# Patient Record
Sex: Male | Born: 1980 | Race: Black or African American | Hispanic: No | Marital: Married | Smoking: Never smoker
Health system: Southern US, Community
[De-identification: ages and names within clinical notes are randomized; demographics above are authoritative.]

## PROBLEM LIST (undated history)

## (undated) DIAGNOSIS — E785 Hyperlipidemia, unspecified: Secondary | ICD-10-CM

## (undated) DIAGNOSIS — I10 Essential (primary) hypertension: Secondary | ICD-10-CM

## (undated) DIAGNOSIS — N529 Male erectile dysfunction, unspecified: Secondary | ICD-10-CM

## (undated) DIAGNOSIS — N183 Chronic kidney disease, stage 3 unspecified: Secondary | ICD-10-CM

## (undated) HISTORY — DX: Male erectile dysfunction, unspecified: N52.9

## (undated) HISTORY — DX: Chronic kidney disease, stage 3 unspecified: N18.30

## (undated) HISTORY — PX: OTHER SURGICAL HISTORY: SHX169

## (undated) HISTORY — DX: Hyperlipidemia, unspecified: E78.5

## (undated) HISTORY — DX: Morbid (severe) obesity due to excess calories: E66.01

---

## 2009-12-17 ENCOUNTER — Emergency Department (HOSPITAL_COMMUNITY): Admission: EM | Admit: 2009-12-17 | Discharge: 2009-12-17 | Payer: Self-pay | Admitting: Family Medicine

## 2018-10-19 DIAGNOSIS — Z20828 Contact with and (suspected) exposure to other viral communicable diseases: Secondary | ICD-10-CM | POA: Diagnosis not present

## 2018-11-02 DIAGNOSIS — I10 Essential (primary) hypertension: Secondary | ICD-10-CM | POA: Diagnosis not present

## 2018-11-07 DIAGNOSIS — I10 Essential (primary) hypertension: Secondary | ICD-10-CM | POA: Diagnosis not present

## 2018-12-15 DIAGNOSIS — Z0000 Encounter for general adult medical examination without abnormal findings: Secondary | ICD-10-CM | POA: Diagnosis not present

## 2018-12-29 DIAGNOSIS — D473 Essential (hemorrhagic) thrombocythemia: Secondary | ICD-10-CM | POA: Diagnosis not present

## 2019-02-14 DIAGNOSIS — D473 Essential (hemorrhagic) thrombocythemia: Secondary | ICD-10-CM | POA: Diagnosis not present

## 2019-03-10 DIAGNOSIS — D473 Essential (hemorrhagic) thrombocythemia: Secondary | ICD-10-CM | POA: Diagnosis not present

## 2019-03-10 DIAGNOSIS — R7989 Other specified abnormal findings of blood chemistry: Secondary | ICD-10-CM | POA: Diagnosis not present

## 2019-03-22 DIAGNOSIS — D473 Essential (hemorrhagic) thrombocythemia: Secondary | ICD-10-CM | POA: Diagnosis not present

## 2019-04-14 ENCOUNTER — Inpatient Hospital Stay (HOSPITAL_COMMUNITY)
Admission: EM | Admit: 2019-04-14 | Discharge: 2019-04-16 | DRG: 177 | Disposition: A | Discharge status: Home / Self Care | Payer: HRSA Program | Attending: Internal Medicine | Admitting: Internal Medicine

## 2019-04-14 ENCOUNTER — Encounter (HOSPITAL_COMMUNITY): Payer: Self-pay

## 2019-04-14 ENCOUNTER — Other Ambulatory Visit: Payer: Self-pay

## 2019-04-14 ENCOUNTER — Emergency Department (HOSPITAL_COMMUNITY): Payer: HRSA Program

## 2019-04-14 DIAGNOSIS — I248 Other forms of acute ischemic heart disease: Secondary | ICD-10-CM | POA: Diagnosis present

## 2019-04-14 DIAGNOSIS — K429 Umbilical hernia without obstruction or gangrene: Secondary | ICD-10-CM | POA: Diagnosis present

## 2019-04-14 DIAGNOSIS — E871 Hypo-osmolality and hyponatremia: Secondary | ICD-10-CM | POA: Diagnosis present

## 2019-04-14 DIAGNOSIS — I10 Essential (primary) hypertension: Secondary | ICD-10-CM | POA: Diagnosis present

## 2019-04-14 DIAGNOSIS — U071 COVID-19: Principal | ICD-10-CM | POA: Diagnosis present

## 2019-04-14 DIAGNOSIS — E860 Dehydration: Secondary | ICD-10-CM | POA: Diagnosis present

## 2019-04-14 DIAGNOSIS — J1289 Other viral pneumonia: Secondary | ICD-10-CM | POA: Diagnosis present

## 2019-04-14 DIAGNOSIS — A0839 Other viral enteritis: Secondary | ICD-10-CM | POA: Diagnosis present

## 2019-04-14 DIAGNOSIS — N179 Acute kidney failure, unspecified: Secondary | ICD-10-CM | POA: Diagnosis present

## 2019-04-14 DIAGNOSIS — Z79899 Other long term (current) drug therapy: Secondary | ICD-10-CM | POA: Diagnosis not present

## 2019-04-14 DIAGNOSIS — G4733 Obstructive sleep apnea (adult) (pediatric): Secondary | ICD-10-CM | POA: Diagnosis present

## 2019-04-14 DIAGNOSIS — Z6841 Body Mass Index (BMI) 40.0 and over, adult: Secondary | ICD-10-CM

## 2019-04-14 DIAGNOSIS — J9601 Acute respiratory failure with hypoxia: Secondary | ICD-10-CM | POA: Diagnosis present

## 2019-04-14 DIAGNOSIS — A4189 Other specified sepsis: Secondary | ICD-10-CM | POA: Diagnosis present

## 2019-04-14 DIAGNOSIS — E669 Obesity, unspecified: Secondary | ICD-10-CM | POA: Diagnosis present

## 2019-04-14 DIAGNOSIS — R197 Diarrhea, unspecified: Secondary | ICD-10-CM | POA: Diagnosis present

## 2019-04-14 HISTORY — DX: Essential (primary) hypertension: I10

## 2019-04-14 LAB — C DIFFICILE QUICK SCREEN W PCR REFLEX
C Diff antigen: NEGATIVE
C Diff interpretation: NOT DETECTED
C Diff toxin: NEGATIVE

## 2019-04-14 LAB — CBC WITH DIFFERENTIAL/PLATELET
Abs Immature Granulocytes: 0.01 10*3/uL (ref 0.00–0.07)
Basophils Absolute: 0 10*3/uL (ref 0.0–0.1)
Basophils Relative: 0 %
Eosinophils Absolute: 0 10*3/uL (ref 0.0–0.5)
Eosinophils Relative: 0 %
HCT: 43.5 % (ref 39.0–52.0)
Hemoglobin: 14.3 g/dL (ref 13.0–17.0)
Immature Granulocytes: 0 %
Lymphocytes Relative: 12 %
Lymphs Abs: 0.8 10*3/uL (ref 0.7–4.0)
MCH: 26.9 pg (ref 26.0–34.0)
MCHC: 32.9 g/dL (ref 30.0–36.0)
MCV: 81.9 fL (ref 80.0–100.0)
Monocytes Absolute: 0.4 10*3/uL (ref 0.1–1.0)
Monocytes Relative: 6 %
Neutro Abs: 5.2 10*3/uL (ref 1.7–7.7)
Neutrophils Relative %: 82 %
Platelets: 307 10*3/uL (ref 150–400)
RBC: 5.31 MIL/uL (ref 4.22–5.81)
RDW: 14.6 % (ref 11.5–15.5)
WBC: 6.3 10*3/uL (ref 4.0–10.5)
nRBC: 0 % (ref 0.0–0.2)

## 2019-04-14 LAB — COMPREHENSIVE METABOLIC PANEL
ALT: 26 U/L (ref 0–44)
AST: 27 U/L (ref 15–41)
Albumin: 4 g/dL (ref 3.5–5.0)
Alkaline Phosphatase: 48 U/L (ref 38–126)
Anion gap: 11 (ref 5–15)
BUN: 13 mg/dL (ref 6–20)
CO2: 23 mmol/L (ref 22–32)
Calcium: 8.5 mg/dL — ABNORMAL LOW (ref 8.9–10.3)
Chloride: 97 mmol/L — ABNORMAL LOW (ref 98–111)
Creatinine, Ser: 1.66 mg/dL — ABNORMAL HIGH (ref 0.61–1.24)
GFR calc Af Amer: >60 mL/min (ref >60)
GFR calc non Af Amer: 52 mL/min — ABNORMAL LOW (ref >60)
Glucose, Bld: 106 mg/dL — ABNORMAL HIGH (ref 70–99)
Potassium: 3.8 mmol/L (ref 3.5–5.1)
Sodium: 131 mmol/L — ABNORMAL LOW (ref 135–145)
Total Bilirubin: 0.8 mg/dL (ref 0.3–1.2)
Total Protein: 7.8 g/dL (ref 6.5–8.1)

## 2019-04-14 LAB — URINALYSIS, ROUTINE W REFLEX MICROSCOPIC
Bilirubin Urine: NEGATIVE
Glucose, UA: NEGATIVE mg/dL
Ketones, ur: NEGATIVE mg/dL
Leukocytes,Ua: NEGATIVE
Nitrite: NEGATIVE
Protein, ur: 100 mg/dL — AB
Specific Gravity, Urine: 1.018 (ref 1.005–1.030)
pH: 5 (ref 5.0–8.0)

## 2019-04-14 LAB — TROPONIN I (HIGH SENSITIVITY)
Troponin I (High Sensitivity): 20 ng/L — ABNORMAL HIGH (ref <18)
Troponin I (High Sensitivity): 30 ng/L — ABNORMAL HIGH (ref <18)

## 2019-04-14 LAB — MRSA PCR SCREENING: MRSA by PCR: NEGATIVE

## 2019-04-14 LAB — FIBRINOGEN: Fibrinogen: 368 mg/dL (ref 210–475)

## 2019-04-14 LAB — POC SARS CORONAVIRUS 2 AG -  ED: SARS Coronavirus 2 Ag: POSITIVE — AB

## 2019-04-14 LAB — LACTIC ACID, PLASMA
Lactic Acid, Venous: 1.2 mmol/L (ref 0.5–1.9)
Lactic Acid, Venous: 1.1 mmol/L (ref 0.5–1.9)

## 2019-04-14 LAB — PROCALCITONIN: Procalcitonin: 0.55 ng/mL

## 2019-04-14 LAB — LIPASE, BLOOD: Lipase: 39 U/L (ref 11–51)

## 2019-04-14 LAB — CBG MONITORING, ED: Glucose-Capillary: 93 mg/dL (ref 70–99)

## 2019-04-14 LAB — FERRITIN: Ferritin: 149 ng/mL (ref 24–336)

## 2019-04-14 LAB — D-DIMER, QUANTITATIVE: D-Dimer, Quant: 0.75 ug/mL-FEU — ABNORMAL HIGH (ref 0.00–0.50)

## 2019-04-14 LAB — C-REACTIVE PROTEIN: CRP: 4.5 mg/dL — ABNORMAL HIGH (ref <1.0)

## 2019-04-14 LAB — TRIGLYCERIDES: Triglycerides: 73 mg/dL (ref <150)

## 2019-04-14 LAB — LACTATE DEHYDROGENASE: LDH: 237 U/L — ABNORMAL HIGH (ref 98–192)

## 2019-04-14 MED ORDER — ONDANSETRON HCL 4 MG/2ML IJ SOLN
4.0000 mg | Freq: Once | INTRAMUSCULAR | Status: DC
  Filled 2019-04-14: qty 2

## 2019-04-14 MED ORDER — IOHEXOL 300 MG/ML  SOLN
100.0000 mL | Freq: Once | INTRAMUSCULAR | Status: AC | PRN
  Administered 2019-04-14: 80 mL via INTRAVENOUS

## 2019-04-14 MED ORDER — FENTANYL CITRATE (PF) 100 MCG/2ML IJ SOLN
50.0000 ug | Freq: Once | INTRAMUSCULAR | Status: DC
  Filled 2019-04-14: qty 2

## 2019-04-14 MED ORDER — ENOXAPARIN SODIUM 60 MG/0.6ML ~~LOC~~ SOLN
60.0000 mg | Freq: Two times a day (BID) | SUBCUTANEOUS | Status: DC
  Filled 2019-04-14: qty 0.6

## 2019-04-14 MED ORDER — CHLORHEXIDINE GLUCONATE CLOTH 2 % EX PADS
6.0000 | MEDICATED_PAD | Freq: Every day | CUTANEOUS | Status: DC
  Administered 2019-04-14: 17:00:00 6 via TOPICAL

## 2019-04-14 MED ORDER — ACETAMINOPHEN 500 MG PO TABS
ORAL_TABLET | ORAL | Status: AC
  Administered 2019-04-14: 1000 mg
  Filled 2019-04-14: qty 2

## 2019-04-14 MED ORDER — ACETAMINOPHEN 500 MG PO TABS
1000.0000 mg | ORAL_TABLET | Freq: Once | ORAL | Status: AC
  Administered 2019-04-14: 12:00:00 1000 mg via ORAL

## 2019-04-14 MED ORDER — SODIUM CHLORIDE 0.9 % IV BOLUS
1000.0000 mL | Freq: Once | INTRAVENOUS | Status: AC
  Administered 2019-04-14: 1000 mL via INTRAVENOUS

## 2019-04-14 MED ORDER — SODIUM CHLORIDE 0.9% FLUSH
3.0000 mL | Freq: Once | INTRAVENOUS | Status: AC
  Administered 2019-04-14: 3 mL via INTRAVENOUS

## 2019-04-14 MED ORDER — ONDANSETRON HCL 4 MG PO TABS: 4.0000 mg | ORAL_TABLET | Freq: Four times a day (QID) | ORAL | Status: DC | PRN

## 2019-04-14 MED ORDER — METHYLPREDNISOLONE SODIUM SUCC 40 MG IJ SOLR
40.0000 mg | Freq: Two times a day (BID) | INTRAMUSCULAR | Status: DC
  Administered 2019-04-14 – 2019-04-16 (×4): 40 mg via INTRAVENOUS
  Filled 2019-04-14 (×4): qty 1

## 2019-04-14 MED ORDER — SODIUM CHLORIDE 0.9 % IV SOLN
100.0000 mg | Freq: Every day | INTRAVENOUS | Status: DC
  Administered 2019-04-15 – 2019-04-16 (×2): 100 mg via INTRAVENOUS
  Filled 2019-04-14 (×2): qty 100

## 2019-04-14 MED ORDER — SODIUM CHLORIDE 0.9 % IV SOLN
200.0000 mg | Freq: Once | INTRAVENOUS | Status: AC
  Administered 2019-04-14: 200 mg via INTRAVENOUS
  Filled 2019-04-14: qty 200

## 2019-04-14 MED ORDER — ONDANSETRON HCL 4 MG/2ML IJ SOLN: 4.0000 mg | Freq: Four times a day (QID) | INTRAMUSCULAR | Status: DC | PRN

## 2019-04-14 MED ORDER — ASCORBIC ACID 500 MG PO TABS
500.0000 mg | ORAL_TABLET | Freq: Every day | ORAL | Status: DC
  Administered 2019-04-14 – 2019-04-16 (×3): 500 mg via ORAL
  Filled 2019-04-14 (×3): qty 1

## 2019-04-14 MED ORDER — HYDRALAZINE HCL 20 MG/ML IJ SOLN
10.0000 mg | Freq: Four times a day (QID) | INTRAMUSCULAR | Status: DC | PRN
  Administered 2019-04-14 – 2019-04-16 (×3): 10 mg via INTRAVENOUS
  Filled 2019-04-14 (×3): qty 1

## 2019-04-14 MED ORDER — SODIUM CHLORIDE (PF) 0.9 % IJ SOLN
INTRAMUSCULAR | Status: AC
  Filled 2019-04-14: qty 50

## 2019-04-14 MED ORDER — ORAL CARE MOUTH RINSE
15.0000 mL | Freq: Two times a day (BID) | OROMUCOSAL | Status: DC
  Administered 2019-04-14 – 2019-04-16 (×4): 15 mL via OROMUCOSAL

## 2019-04-14 MED ORDER — ACETAMINOPHEN 325 MG PO TABS
650.0000 mg | ORAL_TABLET | Freq: Four times a day (QID) | ORAL | Status: DC | PRN
  Administered 2019-04-14 – 2019-04-15 (×2): 650 mg via ORAL
  Filled 2019-04-14 (×2): qty 2

## 2019-04-14 MED ORDER — ENOXAPARIN SODIUM 60 MG/0.6ML ~~LOC~~ SOLN
60.0000 mg | SUBCUTANEOUS | Status: DC
  Administered 2019-04-14 – 2019-04-15 (×2): 60 mg via SUBCUTANEOUS
  Filled 2019-04-14 (×2): qty 0.6

## 2019-04-14 MED ORDER — SODIUM CHLORIDE 0.9 % IV BOLUS
500.0000 mL | Freq: Once | INTRAVENOUS | Status: AC
  Administered 2019-04-14: 19:00:00 500 mL via INTRAVENOUS

## 2019-04-14 MED ORDER — ACETAMINOPHEN 500 MG PO TABS: 1000.0000 mg | ORAL_TABLET | Freq: Once | ORAL | Status: DC

## 2019-04-14 MED ORDER — LACTATED RINGERS IV SOLN: INTRAVENOUS | Status: AC

## 2019-04-14 MED ORDER — IBUPROFEN 800 MG PO TABS
800.0000 mg | ORAL_TABLET | Freq: Once | ORAL | Status: AC
  Administered 2019-04-14: 14:00:00 800 mg via ORAL
  Filled 2019-04-14: qty 1

## 2019-04-14 MED ORDER — ZINC SULFATE 220 (50 ZN) MG PO CAPS
220.0000 mg | ORAL_CAPSULE | Freq: Every day | ORAL | Status: DC
  Administered 2019-04-14 – 2019-04-16 (×3): 220 mg via ORAL
  Filled 2019-04-14 (×3): qty 1

## 2019-04-14 NOTE — ED Notes (Signed)
Patient found to be 87% RA, 3 L Harper applied to bring patient up to 94% RA.

## 2019-04-14 NOTE — H&P (Signed)
History and Physical    Dustin Young ZOX:[id number] DOB: Jul 22, 1980 DOA: 04/14/2019  PCP: Nonnie DoneSlatosky, John J., MD   Patient coming from: home  Chief Complaint: diarrhea  HPI: Dustin ChafeJamarcus D Young is a 38 y.o. male with medical history significant for morbid obesity with BMI 44.7, history of hypertension not on meds comes to the ER for evaluation of diarrhea for the past 24 hours.  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  Reports having loose watery stool multiple times past 24 hours along with generalized abdominal discomfort with associated chills but no vomiting.  Diarrhea is worsened by this time he takes food.  Patient denies any chest pain, shortness of breath, headache, focal weakness, hemoptysis, cough.  In ER: Febrile up to 102.9, blood pressure 160s to 180s, tachycardic 120s, tachypneic 19-30/min, hypoxic with oxygen up to 85% on room airm placed on  Lely 3l  with spo2 at 94%.  Labs showed AKI with creatinine 1.6, lactic acid 1.2, sodium 131, rapid Covid antigen positive, stool was sent for C. difficile and GI panel. CXR "Probable mild hazy opacity identified in the right lung base which may be due to developing pneumonia" CT abdomen no acute abdominal findings but "Innumerable nodular and masslike opacities are identified in the visualized lung bases.The findings are nonspecific but could be due to infectious/inflammatory etiology" .  Review of Systems: All systems were reviewed and were negative except as mentioned in HPI above. Negative for headaches Negative for chest pain Negative for shortness of breath  Past Medical History:  Diagnosis Date   Hypertension    Prior to Admission medications   Not on File    Physical Exam: Vitals:   04/14/19 1247 04/14/19 1400 04/14/19 1401 04/14/19 1413  BP: (!) 181/123 (!) 167/127    Pulse: (!) 119 (!) 126 (!) 124 (!) 124  Resp: (!) 24 19 (!) 30 17  Temp:   (!) 102.9 F (39.4 C)   TempSrc:    Oral   SpO2: 91% (!) 85% (!) 87% 94%  Weight:      Height:       General exam: Obese, alert awake, not in acute distress, on 3 L nasal cannula.  HEENT:Oral mucosa DRY, Ear/Nose WNL grossly, dentition normal. Respiratory system: Bilateral diminished breath sound, limited auscultation due to his body habitus, no use of accessory muscle, non tender on palpation. Cardiovascular system: regular rate and rhythm, tachycardic, S1 & S2 heard, No JVD/murmurs. Gastrointestinal system: Abdomen soft, mildly tender, obese, BS +.  Nervous System:Alert, awake and oriented at baseline. Able to move UE and LE, sensation intact. Extremities: No edema, distal peripheral pulses palpable.  Skin: No rashes,no icterus. MSK: Normal muscle bulk,tone, power  Labs on Admission: I have personally reviewed following labs and imaging studies  CBC: Recent Labs  Lab 04/14/19 1134  WBC 6.3  NEUTROABS 5.2  HGB 14.3  HCT 43.5  MCV 81.9  PLT 307   Basic Metabolic Panel: Recent Labs  Lab 04/14/19 1134  NA 131*  K 3.8  CL 97*  CO2 23  GLUCOSE 106*  BUN 13  CREATININE 1.66*  CALCIUM 8.5*   GFR: Estimated Creatinine Clearance: 92.6 mL/min (A) (by C-G formula based on SCr of 1.66 mg/dL (H)). Liver Function Tests: Recent Labs  Lab 04/14/19 1134  AST 27  ALT 26  ALKPHOS 48  BILITOT 0.8  PROT 7.8  ALBUMIN 4.0   Recent Labs  Lab 04/14/19 1220  LIPASE 39  No results for input(s): AMMONIA in the last 168 hours. Coagulation Profile: No results for input(s): INR, PROTIME in the last 168 hours. Cardiac Enzymes: No results for input(s): CKTOTAL, CKMB, CKMBINDEX, TROPONINI in the last 168 hours. BNP (last 3 results) No results for input(s): PROBNP in the last 8760 hours. HbA1C: No results for input(s): HGBA1C in the last 72 hours. CBG: Recent Labs  Lab 04/14/19 1213  GLUCAP 93   Lipid Profile: No results for input(s): CHOL, HDL, LDLCALC, TRIG, CHOLHDL, LDLDIRECT in the last 72 hours. Thyroid  Function Tests: No results for input(s): TSH, T4TOTAL, FREET4, T3FREE, THYROIDAB in the last 72 hours. Anemia Panel: No results for input(s): VITAMINB12, FOLATE, FERRITIN, TIBC, IRON, RETICCTPCT in the last 72 hours. Urine analysis:    Component Value Date/Time   COLORURINE YELLOW 04/14/2019 1134   APPEARANCEUR CLEAR 04/14/2019 1134   LABSPEC 1.018 04/14/2019 1134   PHURINE 5.0 04/14/2019 1134   GLUCOSEU NEGATIVE 04/14/2019 1134   HGBUR MODERATE (A) 04/14/2019 1134   BILIRUBINUR NEGATIVE 04/14/2019 1134   KETONESUR NEGATIVE 04/14/2019 1134   PROTEINUR 100 (A) 04/14/2019 1134   NITRITE NEGATIVE 04/14/2019 1134   LEUKOCYTESUR NEGATIVE 04/14/2019 1134    Radiological Exams on Admission: CT ABDOMEN PELVIS W CONTRAST  Result Date: 04/14/2019 CLINICAL DATA:  Diarrhea for 24 hours. COVID-19 positive EXAM: CT ABDOMEN AND PELVIS WITH CONTRAST TECHNIQUE: Multidetector CT imaging of the abdomen and pelvis was performed using the standard protocol following bolus administration of intravenous contrast. CONTRAST:  24mL OMNIPAQUE IOHEXOL 300 MG/ML  SOLN COMPARISON:  None. FINDINGS: Lower chest: Innumerable nodular and masslike opacities are identified in the visualized lung bases. There is no pleural effusion. The heart size is normal. Hepatobiliary: No focal liver abnormality is seen. No gallstones, gallbladder wall thickening, or biliary dilatation. Pancreas: Unremarkable. No pancreatic ductal dilatation or surrounding inflammatory changes. Spleen: Normal in size without focal abnormality. Adrenals/Urinary Tract: Adrenal glands are unremarkable. Kidneys are normal, without renal calculi, focal lesion, or hydronephrosis. Bladder is unremarkable. Stomach/Bowel: Stomach is within normal limits. Appendix appears normal. No evidence of bowel wall thickening, distention, or inflammatory changes. Vascular/Lymphatic: No significant vascular findings are present. No enlarged abdominal or pelvic lymph nodes.  Reproductive: Prostate is unremarkable. Other: Small umbilical herniation of mesenteric fat is identified. Musculoskeletal: No acute or significant osseous findings. IMPRESSION: 1. No acute abnormality identified in the abdomen and pelvis. 2. Innumerable nodular and masslike opacities are identified in the visualized lung bases. The findings are nonspecific but could be due to infectious/inflammatory etiology. Electronically Signed   By: Sherian Rein M.D.   On: 04/14/2019 14:30   DG Chest Port 1 View  Result Date: 04/14/2019 CLINICAL DATA:  Diarrhea for 24 hours.  COVID-19 positive. EXAM: PORTABLE CHEST 1 VIEW COMPARISON:  None. FINDINGS: The heart size and mediastinal contours are within normal limits. Probable mild hazy opacity identified in the right lung base. There is no pulmonary edema or pleural effusion. The visualized skeletal structures are unremarkable. IMPRESSION: Probable mild hazy opacity identified in the right lung base which may be due to developing pneumonia. Electronically Signed   By: Sherian Rein M.D.   On: 04/14/2019 12:46    Assessment/Plan  Acute respiratory failure with hypoxia/Pneumonia due to COVID-19 virus: Patient presented with fever tachycardia hypoxia and diarrhea-rapid Covid antigen positive and imaging CXR "Probable mild hazy opacity identified in the right lung base which may be due to developing pneumonia" CT abdomen in lower lungs -"Innumerable nodular and masslike opacities are identified  in the visualized lung bases. The findings are nonspecific but could be due to infectious/inflammatory etiology".  We will start him on remdesivir pharmacy to dose, and Solu-Medrol, zinc, vitamin C. follow-up in inflammatory markers: crp came back and 4.5 ( <7),D dimer at  Slightly + 0.75, PROCAL PENDING.Appears dehydrated and we will keep him on RL ivf- receiving second liter bolus discussed different treatment modalities and patient agreeable with the plan. Admit In step down  unit  Diarrhea "No acute abnormality identified in the abdomen and pelvis in CT abdomen, this is likely due to patient's Covid infection.  C. difficile and GI panel sent.  AKI: creat at 1.6, from diarrhea, covid infection.  He got 1 L bolus  NSS earlier and getting second liter now. We will continue on RLAt 125 ml/hr.  Sinus tachycardia: from #1/2. Monitor. Cont ivf. Check tsh.  Hyponatremia: sod at 131: Due to diarrhea poor oral intake. Continue IV fluid hydration  Essential hypertension- Not taking meds BP on higher side.  Allow blood pressure on the higher side.  Initiate antihypertensive if persistently high  Troponin + slightly up at 20, TREND with 2nd set. He does not have chest pain.  Likely from tachycardia etc. demand mismatch.  Morbid obesity with BMI 44.7 : Check hemoglobin A1c.  Will need healthy lifestyle diet education upon discharge  Body mass index is 44.76 kg/m.   Severity of Illness: The appropriate patient status for this patient is INPATIENT. Inpatient status is judged to be reasonable and necessary in order to provide the required intensity of service to ensure the patient's safety. The patient's presenting symptoms, physical exam findings, and initial radiographic and laboratory data in the context of their chronic comorbidities is felt to place them at high risk for further clinical deterioration. Furthermore, it is not anticipated that the patient will be medically stable for discharge from the hospital within 2 midnights of admission. The following factors support the patient status of inpatient.   " The patient's presenting symptoms include diarrhea. " The worrisome physical exam findings include respiratory distress. " The initial radiographic and laboratory data are worrisome because of covid pneumonia. " The chronic co-morbidities include morbid ovesity   * I certify that at the point of admission it is my clinical judgment that the patient will require  inpatient hospital care spanning beyond 2 midnights from the point of admission due to high intensity of service, high risk for further deterioration and high frequency of surveillance required .    DVT prophylaxis:Lovenox Code Status: Full code Family Communication: Admission, patients condition and plan of care including tests being ordered have been discussed with the patient  who indicate understanding and agree with the plan and Code Status.  Consults called:   Antonieta Pert MD [facility] Pager [phone]  If 7PM-7AM, please contact night-coverage [email or web address]  04/14/2019, 3:08 PM

## 2019-04-14 NOTE — ED Notes (Signed)
Patient transported to CT via stretcher.

## 2019-04-14 NOTE — ED Provider Notes (Signed)
[facility] DEPT Provider Note   CSN: [id number] Arrival date & time: 04/14/19  1108     History Chief Complaint  Patient presents with  . Diarrhea    Dustin Young is a 38 y.o. male hx of HTN, who presented with abdominal pain, diarrhea.  Patient states that he has profuse watery diarrhea since yesterday .  Also has some chills as well but denies any vomiting .  Patient states that whenever he eats a just passed to him and he has diarrhea.  He was noted to be hypoxic 83% on room air and denies any shortness of breath.  Patient denies any contacts with known Covid.   The history is provided by the patient.       Past Medical History:  Diagnosis Date  . Hypertension     There are no problems to display for this patient.   History reviewed. No pertinent surgical history.     No family history on file.  Social History   Tobacco Use  . Smoking status: Never Smoker  . Smokeless tobacco: Never Used  Substance Use Topics  . Alcohol use: Yes    Comment: social  . Drug use: Never    Home Medications Prior to Admission medications   Not on File    Allergies    Patient has no known allergies.  Review of Systems   Review of Systems  Gastrointestinal: Positive for diarrhea.  All other systems reviewed and are negative.   Physical Exam Updated Vital Signs BP (!) 167/127   Pulse (!) 124   Temp (!) 102.9 F (39.4 C) (Oral)   Resp 17   Ht 6' (1.829 m)   Wt (!) 149.7 kg   SpO2 94%   BMI 44.76 kg/m   Physical Exam Vitals and nursing note reviewed.  HENT:     Head: Normocephalic.     Nose: Nose normal.     Mouth/Throat:     Mouth: Mucous membranes are dry.  Eyes:     Extraocular Movements: Extraocular movements intact.     Pupils: Pupils are equal, round, and reactive to light.  Cardiovascular:     Rate and Rhythm: Regular rhythm. Tachycardia present.     Pulses: Normal pulses.     Heart sounds: Normal heart sounds.    Pulmonary:     Effort: Pulmonary effort is normal.     Comments: Diminished throughout  Abdominal:     General: Abdomen is flat.     Comments: + RUQ tenderness, small umbilical hernia that is reducible easily   Musculoskeletal:        General: Normal range of motion.     Cervical back: Normal range of motion.  Skin:    General: Skin is warm.     Capillary Refill: Capillary refill takes less than 2 seconds.  Neurological:     General: No focal deficit present.     Mental Status: He is alert and oriented to person, place, and time.  Psychiatric:        Mood and Affect: Mood normal.        Behavior: Behavior normal.     ED Results / Procedures / Treatments   Labs (all labs ordered are listed, but only abnormal results are displayed) Labs Reviewed  COMPREHENSIVE METABOLIC PANEL - Abnormal; Notable for the following components:      Result Value   Sodium 131 (*)    Chloride 97 (*)    Glucose, Bld  106 (*)    Creatinine, Ser 1.66 (*)    Calcium 8.5 (*)    GFR calc non Af Amer 52 (*)    All other components within normal limits  POC SARS CORONAVIRUS 2 AG -  ED - Abnormal; Notable for the following components:   SARS Coronavirus 2 Ag POSITIVE (*)    All other components within normal limits  TROPONIN I (HIGH SENSITIVITY) - Abnormal; Notable for the following components:   Troponin I (High Sensitivity) 20 (*)    All other components within normal limits  C DIFFICILE QUICK SCREEN W PCR REFLEX  GI PATHOGEN PANEL BY PCR, STOOL  CULTURE, BLOOD (ROUTINE X 2)  CULTURE, BLOOD (ROUTINE X 2)  LACTIC ACID, PLASMA  CBC WITH DIFFERENTIAL/PLATELET  LIPASE, BLOOD  LACTIC ACID, PLASMA  URINALYSIS, ROUTINE W REFLEX MICROSCOPIC  CBG MONITORING, ED    EKG EKG Interpretation  Date/Time:  Friday April 14 2019 11:50:21 EST Ventricular Rate:  108 PR Interval:    QRS Duration: 81 QT Interval:  329 QTC Calculation: 441 R Axis:   58 Text Interpretation: Sinus tachycardia ST  elevation, consider anterior injury No previous ECGs available Confirmed by Richardean CanalYao, Leata Dominy H [phone]([id number]) on 04/14/2019 1:02:23 PM   Radiology CT ABDOMEN PELVIS W CONTRAST  Result Date: 04/14/2019 CLINICAL DATA:  Diarrhea for 24 hours. COVID-19 positive EXAM: CT ABDOMEN AND PELVIS WITH CONTRAST TECHNIQUE: Multidetector CT imaging of the abdomen and pelvis was performed using the standard protocol following bolus administration of intravenous contrast. CONTRAST:  80mL OMNIPAQUE IOHEXOL 300 MG/ML  SOLN COMPARISON:  None. FINDINGS: Lower chest: Innumerable nodular and masslike opacities are identified in the visualized lung bases. There is no pleural effusion. The heart size is normal. Hepatobiliary: No focal liver abnormality is seen. No gallstones, gallbladder wall thickening, or biliary dilatation. Pancreas: Unremarkable. No pancreatic ductal dilatation or surrounding inflammatory changes. Spleen: Normal in size without focal abnormality. Adrenals/Urinary Tract: Adrenal glands are unremarkable. Kidneys are normal, without renal calculi, focal lesion, or hydronephrosis. Bladder is unremarkable. Stomach/Bowel: Stomach is within normal limits. Appendix appears normal. No evidence of bowel wall thickening, distention, or inflammatory changes. Vascular/Lymphatic: No significant vascular findings are present. No enlarged abdominal or pelvic lymph nodes. Reproductive: Prostate is unremarkable. Other: Small umbilical herniation of mesenteric fat is identified. Musculoskeletal: No acute or significant osseous findings. IMPRESSION: 1. No acute abnormality identified in the abdomen and pelvis. 2. Innumerable nodular and masslike opacities are identified in the visualized lung bases. The findings are nonspecific but could be due to infectious/inflammatory etiology. Electronically Signed   By: Sherian ReinWei-Chen  Lin M.D.   On: 04/14/2019 14:30   DG Chest Port 1 View  Result Date: 04/14/2019 CLINICAL DATA:  Diarrhea for 24 hours.   COVID-19 positive. EXAM: PORTABLE CHEST 1 VIEW COMPARISON:  None. FINDINGS: The heart size and mediastinal contours are within normal limits. Probable mild hazy opacity identified in the right lung base. There is no pulmonary edema or pleural effusion. The visualized skeletal structures are unremarkable. IMPRESSION: Probable mild hazy opacity identified in the right lung base which may be due to developing pneumonia. Electronically Signed   By: Sherian ReinWei-Chen  Lin M.D.   On: 04/14/2019 12:46    Procedures Procedures (including critical care time)  CRITICAL CARE Performed by: Richardean Canalavid H Janye Maynor   Total critical care time: 30 minutes  Critical care time was exclusive of separately billable procedures and treating other patients.  Critical care was necessary to treat or prevent imminent or life-threatening deterioration.  Critical care was time spent personally by me on the following activities: development of treatment plan with patient and/or surrogate as well as nursing, discussions with consultants, evaluation of patient's response to treatment, examination of patient, obtaining history from patient or surrogate, ordering and performing treatments and interventions, ordering and review of laboratory studies, ordering and review of radiographic studies, pulse oximetry and re-evaluation of patient's condition.   Medications Ordered in ED Medications  sodium chloride flush (NS) 0.9 % injection 3 mL (has no administration in time range)  ondansetron (ZOFRAN) injection 4 mg (0 mg Intravenous Hold 04/14/19 1400)  fentaNYL (SUBLIMAZE) injection 50 mcg (0 mcg Intravenous Hold 04/14/19 1400)  sodium chloride (PF) 0.9 % injection (has no administration in time range)  acetaminophen (TYLENOL) 500 MG tablet (1,000 mg  Given 04/14/19 1130)  acetaminophen (TYLENOL) tablet 1,000 mg (1,000 mg Oral Given 04/14/19 1130)  sodium chloride 0.9 % bolus 1,000 mL (0 mLs Intravenous Stopped 04/14/19 1402)  iohexol  (OMNIPAQUE) 300 MG/ML solution 100 mL (80 mLs Intravenous Contrast Given 04/14/19 1304)  ibuprofen (ADVIL) tablet 800 mg (800 mg Oral Given 04/14/19 1420)    ED Course  I have reviewed the triage vital signs and the nursing notes.  Pertinent labs & imaging results that were available during my care of the patient were reviewed by me and considered in my medical decision making (see chart for details).    MDM Rules/Calculators/A&P                      Dustin Young is a 38 y.o. male here with diarrhea, hypoxia, fever. Consider COVID vs viral gastro vs hepatitis vs C diff. Will get labs, sepsis workup, CT ab/pel, COVID, C diff, GI pathogen panel. Will hydrate and hold abx for now given possible C diff and COVID.   2:50 PM Patient's COVID is positive. Cr is 1.66, slightly elevated. CT ab/pel unremarkable. Remained hypoxic to 87% when he is off oxygen. Also persistently tachycardic. Will admit for COVID with hypoxia. GI pathogen and C diff sent. Held abx for now.   Dustin Young was evaluated in Emergency Department on 04/14/2019 for the symptoms described in the history of present illness. He was evaluated in the context of the global COVID-19 pandemic, which necessitated consideration that the patient might be at risk for infection with the SARS-CoV-2 virus that causes COVID-19. Institutional protocols and algorithms that pertain to the evaluation of patients at risk for COVID-19 are in a state of rapid change based on information released by regulatory bodies including the CDC and federal and state organizations. These policies and algorithms were followed during the patient's care in the ED.  Final Clinical Impression(s) / ED Diagnoses Final diagnoses:  None    Rx / DC Orders ED Discharge Orders    None       Charlynne Pander, MD 04/14/19 1453

## 2019-04-14 NOTE — ED Triage Notes (Signed)
Pt states that he has been having diarrhea x 24 hours. Pt denies any pain, no blood. Pt denies abd pain. Pt does state that he belly button is more of an outie than normal - no pain there either. Pt states diarrhea every time that he eats/'drinks

## 2019-04-14 NOTE — ED Notes (Signed)
ED TO INPATIENT HANDOFF REPORT  ED Nurse Name and Phone #: [place] G, [phone]  S Name/Age/Gender Dustin Young 38 y.o. male Room/Bed: WA13/WA13  Code Status   Code Status: Not on file  Home/SNF/Other Home Patient oriented to: self, place, time and situation Is this baseline? Yes   Triage Complete: Triage complete  Chief Complaint Acute respiratory failure with hypoxia ([place]) [J96.01]  Triage Note Pt states that he has been having diarrhea x 24 hours. Pt denies any pain, no blood. Pt denies abd pain. Pt does state that he belly button is more of an outie than normal - no pain there either. Pt states diarrhea every time that he eats/'drinks    Allergies No Known Allergies  Level of Care/Admitting Diagnosis ED Disposition    ED Disposition Condition Comment   Admit  Hospital Area: [place] [[id number]]  Level of Care: Stepdown [14]  Admit to SDU based on following criteria: Respiratory Distress:  Frequent assessment and/or intervention to maintain adequate ventilation/respiration, pulmonary toilet, and respiratory treatment.  Covid Evaluation: Confirmed COVID Positive  Diagnosis: Acute respiratory failure with hypoxia [facility] [place] [facility]) [[id number]]  Admitting Physician: Antonieta Pert [[id number]]  Attending Physician: Antonieta Pert [[id number]]  Estimated length of stay: 3 - 4 days  Certification:: I certify this patient will need inpatient services for at least 2 midnights       B Medical/Surgery History Past Medical History:  Diagnosis Date  . Hypertension    History reviewed. No pertinent surgical history.   A IV Location/Drains/Wounds Patient Lines/Drains/Airways Status   Active Line/Drains/Airways    Name:   Placement date:   Placement time:   Site:   Days:   Peripheral IV 04/14/19 Left Antecubital   04/14/19    1205    Antecubital   less than 1          Intake/Output Last 24 hours  Intake/Output Summary (Last 24 hours) at 04/14/2019 1552 Last data  filed at 04/14/2019 1402 Gross per 24 hour  Intake 1000 ml  Output --  Net 1000 ml    Labs/Imaging Results for orders placed or performed during the hospital encounter of 04/14/19 (from the past 48 hour(s))  Lactic acid, plasma     Status: None   Collection Time: 04/14/19 11:34 AM  Result Value Ref Range   Lactic Acid, Venous 1.2 0.5 - 1.9 mmol/L    Comment: Performed at [facility], [place] [address], [place], [place] [address]  Lactic acid, plasma     Status: None   Collection Time: 04/14/19 11:34 AM  Result Value Ref Range   Lactic Acid, Venous 1.1 0.5 - 1.9 mmol/L    Comment: Performed at [facility], [place] [address], [place], [place] [address]  Comprehensive metabolic panel     Status: Abnormal   Collection Time: 04/14/19 11:34 AM  Result Value Ref Range   Sodium 131 (L) 135 - 145 mmol/L   Potassium 3.8 3.5 - 5.1 mmol/L   Chloride 97 (L) 98 - 111 mmol/L   CO2 23 22 - 32 mmol/L   Glucose, Bld 106 (H) 70 - 99 mg/dL   BUN 13 6 - 20 mg/dL   Creatinine, Ser 1.66 (H) 0.61 - 1.24 mg/dL   Calcium 8.5 (L) 8.9 - 10.3 mg/dL   Total Protein 7.8 6.5 - 8.1 g/dL   Albumin 4.0 3.5 - 5.0 g/dL   AST 27 15 - 41 U/L   ALT 26 0 - 44 U/L  Alkaline Phosphatase 48 38 - 126 U/L   Total Bilirubin 0.8 0.3 - 1.2 mg/dL   GFR calc non Af Amer 52 (L) >60 mL/min   GFR calc Af Amer >60 >60 mL/min   Anion gap 11 5 - 15    Comment: Performed at [facility], [place] [address] [place], [place] [address]  CBC with Differential     Status: None   Collection Time: 04/14/19 11:34 AM  Result Value Ref Range   WBC 6.3 4.0 - 10.5 K/uL   RBC 5.31 4.22 - 5.81 MIL/uL   Hemoglobin 14.3 13.0 - 17.0 g/dL   HCT 43.5 39.0 - 52.0 %   MCV 81.9 80.0 - 100.0 fL   MCH 26.9 26.0 - 34.0 pg   MCHC 32.9 30.0 - 36.0 g/dL   RDW 14.6 11.5 - 15.5 %   Platelets 307 150 - 400 K/uL   nRBC 0.0 0.0 - 0.2 %   Neutrophils Relative % 82 %   Neutro Abs 5.2 1.7 - 7.7 K/uL    Lymphocytes Relative 12 %   Lymphs Abs 0.8 0.7 - 4.0 K/uL   Monocytes Relative 6 %   Monocytes Absolute 0.4 0.1 - 1.0 K/uL   Eosinophils Relative 0 %   Eosinophils Absolute 0.0 0.0 - 0.5 K/uL   Basophils Relative 0 %   Basophils Absolute 0.0 0.0 - 0.1 K/uL   Immature Granulocytes 0 %   Abs Immature Granulocytes 0.01 0.00 - 0.07 K/uL    Comment: Performed at [facility], [place] [address] [place], [place] [address]  Urinalysis, Routine w reflex microscopic     Status: Abnormal   Collection Time: 04/14/19 11:34 AM  Result Value Ref Range   Color, Urine YELLOW YELLOW   APPearance CLEAR CLEAR   Specific Gravity, Urine 1.018 1.005 - 1.030   pH 5.0 5.0 - 8.0   Glucose, UA NEGATIVE NEGATIVE mg/dL   Hgb urine dipstick MODERATE (A) NEGATIVE   Bilirubin Urine NEGATIVE NEGATIVE   Ketones, ur NEGATIVE NEGATIVE mg/dL   Protein, ur 100 (A) NEGATIVE mg/dL   Nitrite NEGATIVE NEGATIVE   Leukocytes,Ua NEGATIVE NEGATIVE   RBC / HPF 0-5 0 - 5 RBC/hpf   WBC, UA 6-10 0 - 5 WBC/hpf   Bacteria, UA RARE (A) NONE SEEN   Squamous Epithelial / LPF 0-5 0 - 5   Mucus PRESENT     Comment: Performed at [facility], [place] [address], [place],  [address]  CBG monitoring, ED     Status: None   Collection Time: 04/14/19 12:13 PM  Result Value Ref Range   Glucose-Capillary 93 70 - 99 mg/dL  Troponin I (High Sensitivity)     Status: Abnormal   Collection Time: 04/14/19 12:20 PM  Result Value Ref Range   Troponin I (High Sensitivity) 20 (H) <18 ng/L    Comment: (NOTE) Elevated high sensitivity troponin I (hsTnI) values and significant  changes across serial measurements may suggest ACS but many other  chronic and acute conditions are known to elevate hsTnI results.  Refer to the "Links" section for chest pain algorithms and additional  guidance. Performed at [facility], [place] [address], [place],  [address]   Lipase, blood     Status: None    Collection Time: 04/14/19 12:20 PM  Result Value Ref Range   Lipase 39 11 - 51 U/L    Comment: Performed at [facility], [place] [address]  [place], [place] [address]  POC SARS Coronavirus 2 Ag-ED - Nasal Swab (BD Veritor Kit)     Status: Abnormal   Collection Time: 04/14/19 12:34 PM  Result Value Ref Range   SARS Coronavirus 2 Ag POSITIVE (A) NEGATIVE    Comment: (NOTE) SARS-CoV-2 antigen PRESENT. Positive results indicate the presence of viral antigens, but clinical correlation with patient history and other diagnostic information is necessary to determine patient infection status.  Positive results do not rule out bacterial infection or co-infection  with other viruses. False positive results are rare but can occur, and confirmatory RT-PCR testing may be appropriate in some circumstances. The expected result is Negative. Fact Sheet for Patients: PodPark.tn Fact Sheet for Providers: GiftContent.is  This test is not yet approved or cleared by the [place] FDA and  has been authorized for detection and/or diagnosis of SARS-CoV-2 by FDA under an Emergency Use Authorization (EUA).  This EUA will remain in effect (meaning this test can be used) for the duration of  the COVID-19 declaration under Section 564(b)(1) of the Act, 21 U.S.C. section 360bbb-3(b)(1), unless the a uthorization is terminated or revoked sooner.    CT ABDOMEN PELVIS W CONTRAST  Result Date: 04/14/2019 CLINICAL DATA:  Diarrhea for 24 hours. COVID-19 positive EXAM: CT ABDOMEN AND PELVIS WITH CONTRAST TECHNIQUE: Multidetector CT imaging of the abdomen and pelvis was performed using the standard protocol following bolus administration of intravenous contrast. CONTRAST:  63m OMNIPAQUE IOHEXOL 300 MG/ML  SOLN COMPARISON:  None. FINDINGS: Lower chest: Innumerable nodular and masslike opacities are identified in the visualized lung bases.  There is no pleural effusion. The heart size is normal. Hepatobiliary: No focal liver abnormality is seen. No gallstones, gallbladder wall thickening, or biliary dilatation. Pancreas: Unremarkable. No pancreatic ductal dilatation or surrounding inflammatory changes. Spleen: Normal in size without focal abnormality. Adrenals/Urinary Tract: Adrenal glands are unremarkable. Kidneys are normal, without renal calculi, focal lesion, or hydronephrosis. Bladder is unremarkable. Stomach/Bowel: Stomach is within normal limits. Appendix appears normal. No evidence of bowel wall thickening, distention, or inflammatory changes. Vascular/Lymphatic: No significant vascular findings are present. No enlarged abdominal or pelvic lymph nodes. Reproductive: Prostate is unremarkable. Other: Small umbilical herniation of mesenteric fat is identified. Musculoskeletal: No acute or significant osseous findings. IMPRESSION: 1. No acute abnormality identified in the abdomen and pelvis. 2. Innumerable nodular and masslike opacities are identified in the visualized lung bases. The findings are nonspecific but could be due to infectious/inflammatory etiology. Electronically Signed   By: WAbelardo DieselM.D.   On: 04/14/2019 14:30   DG Chest Port 1 View  Result Date: 04/14/2019 CLINICAL DATA:  Diarrhea for 24 hours.  COVID-19 positive. EXAM: PORTABLE CHEST 1 VIEW COMPARISON:  None. FINDINGS: The heart size and mediastinal contours are within normal limits. Probable mild hazy opacity identified in the right lung base. There is no pulmonary edema or pleural effusion. The visualized skeletal structures are unremarkable. IMPRESSION: Probable mild hazy opacity identified in the right lung base which may be due to developing pneumonia. Electronically Signed   By: WAbelardo DieselM.D.   On: 04/14/2019 12:46    Pending Labs Unresulted Labs (From admission, onward)    Start     Ordered   04/14/19 1506  D-dimer, quantitative  ONCE - STAT,   STAT     Comments: Used for prognosis and bed placement. Do not order CT or V/Q.    04/14/19 1505   04/14/19 1506  Procalcitonin  ONCE - STAT,   STAT  04/14/19 1505   04/14/19 1506  Lactate dehydrogenase  Once,   STAT     04/14/19 1505   04/14/19 1506  Ferritin  Once,   STAT     04/14/19 1505   04/14/19 1506  Triglycerides  Once,   STAT     04/14/19 1505   04/14/19 1506  Fibrinogen  Once,   STAT     04/14/19 1505   04/14/19 1506  C-reactive protein  Once,   STAT     04/14/19 1505   04/14/19 1220  Blood culture (routine x 2)  BLOOD CULTURE X 2,   STAT     04/14/19 1219   04/14/19 1218  C difficile quick scan w PCR reflex  (C Difficile quick screen w PCR reflex panel)  Once, for 24 hours,   STAT     04/14/19 1218   04/14/19 1218  GI pathogen panel by PCR, stool  (Gastrointestinal Panel by PCR, Stool                                                                                                                                                     *Does Not include CLOSTRIDIUM DIFFICILE testing.**If CDIFF testing is needed, select the C Difficile Quick Screen w PCR reflex order below)  Once,   STAT     04/14/19 1218          Vitals/Pain Today's Vitals   04/14/19 1401 04/14/19 1413 04/14/19 1500 04/14/19 1547  BP:   (!) 179/92   Pulse: (!) 124 (!) 124 (!) 123   Resp: (!) 30 17 (!) 23   Temp: (!) 102.9 F (39.4 C)   (!) 102.6 F (39.2 C)  TempSrc: Oral   Oral  SpO2: (!) 87% 94% 95%   Weight:      Height:      PainSc: 0-No pain       Isolation Precautions Enteric precautions (UV disinfection)  Medications Medications  sodium chloride flush (NS) 0.9 % injection 3 mL (has no administration in time range)  ondansetron (ZOFRAN) injection 4 mg (0 mg Intravenous Hold 04/14/19 1400)  fentaNYL (SUBLIMAZE) injection 50 mcg (0 mcg Intravenous Hold 04/14/19 1400)  sodium chloride (PF) 0.9 % injection (has no administration in time range)  acetaminophen (TYLENOL) 500 MG tablet (1,000 mg   Given 04/14/19 1130)  acetaminophen (TYLENOL) tablet 1,000 mg (1,000 mg Oral Given 04/14/19 1130)  sodium chloride 0.9 % bolus 1,000 mL (0 mLs Intravenous Stopped 04/14/19 1402)  iohexol (OMNIPAQUE) 300 MG/ML solution 100 mL (80 mLs Intravenous Contrast Given 04/14/19 1304)  ibuprofen (ADVIL) tablet 800 mg (800 mg Oral Given 04/14/19 1420)  sodium chloride 0.9 % bolus 1,000 mL (1,000 mLs Intravenous New Bag/Given 04/14/19 1545)    Mobility walks Low fall risk

## 2019-04-14 NOTE — ED Notes (Signed)
Report given to Barbara, RN

## 2019-04-15 DIAGNOSIS — E669 Obesity, unspecified: Secondary | ICD-10-CM | POA: Diagnosis present

## 2019-04-15 DIAGNOSIS — U071 COVID-19: Principal | ICD-10-CM

## 2019-04-15 DIAGNOSIS — A09 Infectious gastroenteritis and colitis, unspecified: Secondary | ICD-10-CM

## 2019-04-15 DIAGNOSIS — N179 Acute kidney failure, unspecified: Secondary | ICD-10-CM

## 2019-04-15 LAB — COMPREHENSIVE METABOLIC PANEL
ALT: 27 U/L (ref 0–44)
AST: 41 U/L (ref 15–41)
Albumin: 3.8 g/dL (ref 3.5–5.0)
Alkaline Phosphatase: 45 U/L (ref 38–126)
Anion gap: 10 (ref 5–15)
BUN: 12 mg/dL (ref 6–20)
CO2: 23 mmol/L (ref 22–32)
Calcium: 8.5 mg/dL — ABNORMAL LOW (ref 8.9–10.3)
Chloride: 103 mmol/L (ref 98–111)
Creatinine, Ser: 1.65 mg/dL — ABNORMAL HIGH (ref 0.61–1.24)
GFR calc Af Amer: >60 mL/min (ref >60)
GFR calc non Af Amer: 53 mL/min — ABNORMAL LOW (ref >60)
Glucose, Bld: 127 mg/dL — ABNORMAL HIGH (ref 70–99)
Potassium: 4.4 mmol/L (ref 3.5–5.1)
Sodium: 136 mmol/L (ref 135–145)
Total Bilirubin: 0.7 mg/dL (ref 0.3–1.2)
Total Protein: 8.1 g/dL (ref 6.5–8.1)

## 2019-04-15 LAB — TSH: TSH: 2.478 u[IU]/mL (ref 0.350–4.500)

## 2019-04-15 LAB — CBC WITH DIFFERENTIAL/PLATELET
Abs Immature Granulocytes: 0.26 10*3/uL — ABNORMAL HIGH (ref 0.00–0.07)
Basophils Absolute: 0 10*3/uL (ref 0.0–0.1)
Basophils Relative: 0 %
Eosinophils Absolute: 0 10*3/uL (ref 0.0–0.5)
Eosinophils Relative: 0 %
HCT: 45.7 % (ref 39.0–52.0)
Hemoglobin: 15 g/dL (ref 13.0–17.0)
Immature Granulocytes: 1 %
Lymphocytes Relative: 3 %
Lymphs Abs: 0.6 10*3/uL — ABNORMAL LOW (ref 0.7–4.0)
MCH: 27.6 pg (ref 26.0–34.0)
MCHC: 32.8 g/dL (ref 30.0–36.0)
MCV: 84.2 fL (ref 80.0–100.0)
Monocytes Absolute: 0.3 10*3/uL (ref 0.1–1.0)
Monocytes Relative: 2 %
Neutro Abs: 19.4 10*3/uL — ABNORMAL HIGH (ref 1.7–7.7)
Neutrophils Relative %: 94 %
Platelets: 327 10*3/uL (ref 150–400)
RBC: 5.43 MIL/uL (ref 4.22–5.81)
RDW: 15.1 % (ref 11.5–15.5)
WBC: 20.6 10*3/uL — ABNORMAL HIGH (ref 4.0–10.5)
nRBC: 0 % (ref 0.0–0.2)

## 2019-04-15 LAB — PHOSPHORUS: Phosphorus: 4 mg/dL (ref 2.5–4.6)

## 2019-04-15 LAB — D-DIMER, QUANTITATIVE: D-Dimer, Quant: 0.83 ug/mL-FEU — ABNORMAL HIGH (ref 0.00–0.50)

## 2019-04-15 LAB — HEMOGLOBIN A1C
Hgb A1c MFr Bld: 5.6 % (ref 4.8–5.6)
Mean Plasma Glucose: 114.02 mg/dL

## 2019-04-15 LAB — FERRITIN: Ferritin: 189 ng/mL (ref 24–336)

## 2019-04-15 LAB — C-REACTIVE PROTEIN: CRP: 16.1 mg/dL — ABNORMAL HIGH (ref <1.0)

## 2019-04-15 LAB — ABO/RH: ABO/RH(D): A POS

## 2019-04-15 LAB — PROCALCITONIN: Procalcitonin: 4.57 ng/mL

## 2019-04-15 LAB — HIV ANTIBODY (ROUTINE TESTING W REFLEX): HIV Screen 4th Generation wRfx: NONREACTIVE

## 2019-04-15 LAB — MAGNESIUM: Magnesium: 1.9 mg/dL (ref 1.7–2.4)

## 2019-04-15 MED ORDER — SODIUM CHLORIDE 0.9 % IV SOLN: INTRAVENOUS | Status: DC

## 2019-04-15 MED ORDER — METRONIDAZOLE IN NACL 5-0.79 MG/ML-% IV SOLN
500.0000 mg | Freq: Three times a day (TID) | INTRAVENOUS | Status: DC
  Administered 2019-04-15 – 2019-04-16 (×5): 500 mg via INTRAVENOUS
  Filled 2019-04-15 (×5): qty 100

## 2019-04-15 MED ORDER — METOPROLOL TARTRATE 25 MG PO TABS
25.0000 mg | ORAL_TABLET | Freq: Two times a day (BID) | ORAL | Status: DC
  Administered 2019-04-15 – 2019-04-16 (×3): 25 mg via ORAL
  Filled 2019-04-15 (×3): qty 1

## 2019-04-15 NOTE — Progress Notes (Signed)
[facility]                                                                              Patient Demographics  Dustin Young, is a 38 y.o. male, DOB - 03-11-1981, JQB:[id number]  Admit date - 04/14/2019   Admitting Physician Antonieta Pert, MD  Outpatient Primary MD for the patient is [place], Marshall Cork., MD  Outpatient specialists:   LOS - 1  days   Medical records reviewed and are as summarized below:    Chief Complaint  Patient presents with  . Diarrhea       Brief summary   Patient is a 38 year old male with history of morbid obesity, BMI 44.7, hypertension not on meds presented to ED for diarrhea for the past 24 hours PTA.  Patient reported no known Covid exposures.  Reported multiple loose watery stools, generalized abdominal discomfort, associated chills.  No vomiting.  Diarrhea worsens on eating. In ED, febrile 102.9 F.  BP elevated 160s to 180s, tachycardia heart rate in 120s, tachypnea 19-30.  Hypoxia O2 sats 85% on room air, was placed on 3 L O2. Labs showed AKI creatinine 1.6, lactic acid 1.2, sodium 131.  COVID-19 test positive   Assessment & Plan    Principal Problem:  Acute hypoxic respiratory failure due to acute COVID-19 viral pneumonia during the ongoing 2020 COVID-19 pandemic- POA with sepsis -Patient met sepsis criteria at the time of admission with fevers, tachypnea, tachycardia, hypoxia, AKI creatinine 1.6 likely due to acute COVID-19 viral illness, gastroenteritis and  - Currently hypoxic, O2 sats 93 to 95%, requiring 2 L nasal cannula, hypoxia improving from admission - Continue current management: IV Solu-Medrol, remdesivir per pharmacy protocol, day #2   - If worsening O2 requirements, will consider Actemra.  Holding for now, procalcitonin went from 0.5 on admission to 4.57.  Follow blood cultures, GI pathogen panel. - Continue Supportive care: vitamin C/zinc, albuterol, Tylenol. - Continue to wean oxygen, ambulatory O2 screening  daily as tolerated.  Was transiently hypoxic at night, likely has underlying undiagnosed OSA, will benefit from outpatient sleep study  - Oxygen - SpO2: 95 % O2 Flow Rate (L/min): 2 L/min - Continue to follow labs as below, CRP trending up  No results found for: [place]  Lab 04/14/19 1134 04/14/19 1546 04/15/19 0206  DDIMER  --  0.75* 0.83*  FERRITIN  --  149 189  CRP  --  4.5* 16.1*  ALT 26  --  27  PROCALCITON  --  0.55 4.57      Active Problems: COVID-19 viral gastroenteritis - C. difficile negative, GI pathogen panel pending - CT abdomen showed innumerable nodular and masslike opacities in lung bases, nonspecific or infectious/inflammatory etiology.  No acute intra-abdominal pathology. - Worsening procalcitonin, leukocytosis (20.6 however on IV Solu-Medrol), follow blood cultures, will place on IV Flagyl     AKI (acute kidney injury) ([place]) -Creatinine 1.6 at the time of admission, sodium 131 -Creatinine still 1.6, will continue gentle hydration, encourage p.o. diet     Essential hypertension -BP uncontrolled, patient states he used to be on antihypertensive, caused swelling in his feet  and hence he stopped, likely was amlodipine. -Patient started on metoprolol 25 mg twice daily -Continue hydralazine IV as needed with parameters  Morbid obesity Estimated body mass index is 47.27 kg/m as calculated from the following:   Height as of this encounter: 6' (1.829 m).   Weight as of this encounter: 158.1 kg.  Code Status: Full code DVT Prophylaxis:  Lovenox Family Communication: Discussed all imaging results, lab results, explained to the patient's wife on the phone   Disposition Plan:Pending clinical status, currently inpatient given acute hypoxic respiratory failure, acute COVID-19 illness.  Once ambulatory without overt symptoms or hypoxia would consider discharge home.  Time Spent in minutes : Patient stable to move out of stepdown unit.  Transfer  to telemetry floor  Procedures:  None  Consultants:   None  Antimicrobials:   Anti-infectives (From admission, onward)   Start     Dose/Rate Route Frequency Ordered Stop   04/15/19 1000  remdesivir 100 mg in sodium chloride 0.9 % 100 mL IVPB     100 mg 200 mL/hr over 30 Minutes Intravenous Daily 04/14/19 1603 04/19/19 0959   04/15/19 0900  metroNIDAZOLE (FLAGYL) IVPB 500 mg     500 mg 100 mL/hr over 60 Minutes Intravenous Every 8 hours 04/15/19 0804     04/14/19 1700  remdesivir 200 mg in sodium chloride 0.9% 250 mL IVPB     200 mg 580 mL/hr over 30 Minutes Intravenous Once 04/14/19 1603 04/14/19 1722         Medications  Scheduled Meds: . vitamin C  500 mg Oral Daily  . Chlorhexidine Gluconate Cloth  6 each Topical Daily  . enoxaparin (LOVENOX) injection  60 mg Subcutaneous Q24H  . fentaNYL (SUBLIMAZE) injection  50 mcg Intravenous Once  . mouth rinse  15 mL Mouth Rinse BID  . methylPREDNISolone (SOLU-MEDROL) injection  40 mg Intravenous BID  . metoprolol tartrate  25 mg Oral BID  . ondansetron (ZOFRAN) IV  4 mg Intravenous Once  . zinc sulfate  220 mg Oral Daily   Continuous Infusions: . lactated ringers 20 mL/hr at 04/15/19 1128  . metronidazole Stopped (04/15/19 1111)  . remdesivir 100 mg in NS 100 mL 100 mg (04/15/19 1126)   PRN Meds:.acetaminophen, hydrALAZINE, ondansetron **OR** ondansetron (ZOFRAN) IV      Subjective:   Dustin Young was seen and examined today.  Sitting up in the chair, still has diarrhea, no active nausea or vomiting.  BP uncontrolled.  Denies any chest pain. Patient denies dizziness,  new weakness, numbess, tingling.  Objective:   Vitals:   04/15/19 0727 04/15/19 0800 04/15/19 0900 04/15/19 1107  BP: (!) 159/105   (!) 166/102  Pulse: (!) 116 (!) 115  (!) 116  Resp: _0 Temp:  99.3 F (37.4 C)  98.9 F (37.2 C)  TempSrc:  Oral  Oral  SpO2: 94% 93%  95%  Weight:      Height:        Intake/Output Summary (Last  24 hours) at 04/15/2019 1220 Last data filed at 04/15/2019 1048 Gross per 24 hour  Intake 4526.19 ml  Output 1825 ml  Net 2701.19 ml     Wt Readings from Last 3 Encounters:  04/14/19 (!) 158.1 kg     Exam  General: Alert and oriented x 3, NAD  Eyes: ,  HEENT:  Atraumatic, normocephalic  Cardiovascular: S1 S2 auscultated,  RRR, tachycardia  Respiratory: Decreased breath sound at the bases  Gastrointestinal: Soft, minimal  tenderness, nondistended, + bowel sounds  Ext: no pedal edema bilaterally  Neuro: No new FND's  Musculoskeletal: No digital cyanosis, clubbing  Skin: No rashes  Psych: Normal affect and demeanor, alert and oriented x3    Data Reviewed:  I have personally reviewed following labs and imaging studies  Micro Results Recent Results (from the past 240 hour(s))  Blood culture (routine x 2)     Status: None (Preliminary result)   Collection Time: 04/14/19 12:20 PM   Specimen: BLOOD  Result Value Ref Range Status   Specimen Description   Final    BLOOD RIGHT ANTECUBITAL Performed at [facility] [address] [place], [place] [address]    Special Requests   Final    BOTTLES DRAWN AEROBIC AND ANAEROBIC Blood Culture results may not be optimal due to an inadequate volume of blood received in culture bottles Performed at [facility] [address], [place], [place] [address]    Culture   Final    NO GROWTH < 12 HOURS Performed at [facility] [address] [place], [place] [address]    Report Status PENDING  Incomplete  C difficile quick scan w PCR reflex     Status: None   Collection Time: 04/14/19  1:33 PM   Specimen: STOOL  Result Value Ref Range Status   C Diff antigen NEGATIVE NEGATIVE Final   C Diff toxin NEGATIVE NEGATIVE Final   C Diff interpretation No C. difficile detected.  Final    Comment: Performed at [facility], [place] [address] [place], [place] [address]    MRSA PCR Screening     Status: None   Collection Time: 04/14/19  4:54 PM   Specimen: Nasal Mucosa; Nasopharyngeal  Result Value Ref Range Status   MRSA by PCR NEGATIVE NEGATIVE Final    Comment:        The GeneXpert MRSA Assay (FDA approved for NASAL specimens only), is one component of a comprehensive MRSA colonization surveillance program. It is not intended to diagnose MRSA infection nor to guide or monitor treatment for MRSA infections. Performed at [facility], [place] [address] [place],  [address]     Radiology Reports CT ABDOMEN PELVIS W CONTRAST  Result Date: 04/14/2019 CLINICAL DATA:  Diarrhea for 24 hours. COVID-19 positive EXAM: CT ABDOMEN AND PELVIS WITH CONTRAST TECHNIQUE: Multidetector CT imaging of the abdomen and pelvis was performed using the standard protocol following bolus administration of intravenous contrast. CONTRAST:  53m OMNIPAQUE IOHEXOL 300 MG/ML  SOLN COMPARISON:  None. FINDINGS: Lower chest: Innumerable nodular and masslike opacities are identified in the visualized lung bases. There is no pleural effusion. The heart size is normal. Hepatobiliary: No focal liver abnormality is seen. No gallstones, gallbladder wall thickening, or biliary dilatation. Pancreas: Unremarkable. No pancreatic ductal dilatation or surrounding inflammatory changes. Spleen: Normal in size without focal abnormality. Adrenals/Urinary Tract: Adrenal glands are unremarkable. Kidneys are normal, without renal calculi, focal lesion, or hydronephrosis. Bladder is unremarkable. Stomach/Bowel: Stomach is within normal limits. Appendix appears normal. No evidence of bowel wall thickening, distention, or inflammatory changes. Vascular/Lymphatic: No significant vascular findings are present. No enlarged abdominal or pelvic lymph nodes. Reproductive: Prostate is unremarkable. Other: Small umbilical herniation of mesenteric fat is identified. Musculoskeletal: No acute or  significant osseous findings. IMPRESSION: 1. No acute abnormality identified in the abdomen and pelvis. 2. Innumerable nodular and masslike opacities are identified in the visualized lung bases. The findings are nonspecific  but could be due to infectious/inflammatory etiology. Electronically Signed   By: Abelardo Diesel M.D.   On: 04/14/2019 14:30   DG Chest Port 1 View  Result Date: 04/14/2019 CLINICAL DATA:  Diarrhea for 24 hours.  COVID-19 positive. EXAM: PORTABLE CHEST 1 VIEW COMPARISON:  None. FINDINGS: The heart size and mediastinal contours are within normal limits. Probable mild hazy opacity identified in the right lung base. There is no pulmonary edema or pleural effusion. The visualized skeletal structures are unremarkable. IMPRESSION: Probable mild hazy opacity identified in the right lung base which may be due to developing pneumonia. Electronically Signed   By: Abelardo Diesel M.D.   On: 04/14/2019 12:46    Lab Data:  CBC: Recent Labs  Lab 04/14/19 1134 04/15/19 0206  WBC 6.3 20.6*  NEUTROABS 5.2 19.4*  HGB 14.3 15.0  HCT 43.5 45.7  MCV 81.9 84.2  PLT 307 433   Basic Metabolic Panel: Recent Labs  Lab 04/14/19 1134 04/15/19 0206  NA 131* 136  K 3.8 4.4  CL 97* 103  CO2 23 23  GLUCOSE 106* 127*  BUN 13 12  CREATININE 1.66* 1.65*  CALCIUM 8.5* 8.5*  MG  --  1.9  PHOS  --  4.0   GFR: Estimated Creatinine Clearance: 96.1 mL/min (A) (by C-G formula based on SCr of 1.65 mg/dL (H)). Liver Function Tests: Recent Labs  Lab 04/14/19 1134 04/15/19 0206  AST 27 41  ALT 26 27  ALKPHOS 48 45  BILITOT 0.8 0.7  PROT 7.8 8.1  ALBUMIN 4.0 3.8   Recent Labs  Lab 04/14/19 1220  LIPASE 39   No results for input(s): AMMONIA in the last 168 hours. Coagulation Profile: No results for input(s): INR, PROTIME in the last 168 hours. Cardiac Enzymes: No results for input(s): CKTOTAL, CKMB, CKMBINDEX, TROPONINI in the last 168 hours. BNP (last 3 results) No results for  input(s): PROBNP in the last 8760 hours. HbA1C: Recent Labs    04/15/19 0206  HGBA1C 5.6   CBG: Recent Labs  Lab 04/14/19 1213  GLUCAP 93   Lipid Profile: Recent Labs    04/14/19 1546  TRIG 73   Thyroid Function Tests: Recent Labs    04/15/19 0206  TSH 2.478   Anemia Panel: Recent Labs    04/14/19 1546 04/15/19 0206  FERRITIN 149 189   Urine analysis:    Component Value Date/Time   COLORURINE YELLOW 04/14/2019 [place] 04/14/2019 1134   LABSPEC 1.018 04/14/2019 1134   PHURINE 5.0 04/14/2019 1134   [place] 04/14/2019 1134   HGBUR MODERATE (A) 04/14/2019 1134   BILIRUBINUR NEGATIVE 04/14/2019 1134   [place] 04/14/2019 1134   PROTEINUR 100 (A) 04/14/2019 1134   NITRITE NEGATIVE 04/14/2019 1134   LEUKOCYTESUR NEGATIVE 04/14/2019 1134     Liandro Thelin M.D. [facility] 04/15/2019, 12:20 PM   Call night coverage person covering after 7pm

## 2019-04-15 NOTE — Progress Notes (Signed)
Pt arrived from ICU Mews score yellow due to pulse of 116. No acute changes vitals are stable. MD aware of pulse.

## 2019-04-15 NOTE — Progress Notes (Signed)
Patient to transfer to 1432 report given to receiving nurse, all questions answered at this time.  Pt. VSS with no s/s of distress.  Patient stable at transfer.

## 2019-04-16 LAB — COMPREHENSIVE METABOLIC PANEL
ALT: 28 U/L (ref 0–44)
AST: 39 U/L (ref 15–41)
Albumin: 3.7 g/dL (ref 3.5–5.0)
Alkaline Phosphatase: 46 U/L (ref 38–126)
Anion gap: 16 — ABNORMAL HIGH (ref 5–15)
BUN: 17 mg/dL (ref 6–20)
CO2: 20 mmol/L — ABNORMAL LOW (ref 22–32)
Calcium: 8.2 mg/dL — ABNORMAL LOW (ref 8.9–10.3)
Chloride: 99 mmol/L (ref 98–111)
Creatinine, Ser: 1.43 mg/dL — ABNORMAL HIGH (ref 0.61–1.24)
GFR calc Af Amer: >60 mL/min (ref >60)
GFR calc non Af Amer: >60 mL/min (ref >60)
Glucose, Bld: 130 mg/dL — ABNORMAL HIGH (ref 70–99)
Potassium: 4.1 mmol/L (ref 3.5–5.1)
Sodium: 135 mmol/L (ref 135–145)
Total Bilirubin: 0.8 mg/dL (ref 0.3–1.2)
Total Protein: 7.9 g/dL (ref 6.5–8.1)

## 2019-04-16 LAB — FERRITIN: Ferritin: 273 ng/mL (ref 24–336)

## 2019-04-16 LAB — CBC WITH DIFFERENTIAL/PLATELET
Abs Immature Granulocytes: 0.14 10*3/uL — ABNORMAL HIGH (ref 0.00–0.07)
Basophils Absolute: 0 10*3/uL (ref 0.0–0.1)
Basophils Relative: 0 %
Eosinophils Absolute: 0 10*3/uL (ref 0.0–0.5)
Eosinophils Relative: 0 %
HCT: 43.4 % (ref 39.0–52.0)
Hemoglobin: 14.4 g/dL (ref 13.0–17.0)
Immature Granulocytes: 1 %
Lymphocytes Relative: 4 %
Lymphs Abs: 0.8 10*3/uL (ref 0.7–4.0)
MCH: 27.2 pg (ref 26.0–34.0)
MCHC: 33.2 g/dL (ref 30.0–36.0)
MCV: 81.9 fL (ref 80.0–100.0)
Monocytes Absolute: 0.2 10*3/uL (ref 0.1–1.0)
Monocytes Relative: 1 %
Neutro Abs: 17.6 10*3/uL — ABNORMAL HIGH (ref 1.7–7.7)
Neutrophils Relative %: 94 %
Platelets: 330 10*3/uL (ref 150–400)
RBC: 5.3 MIL/uL (ref 4.22–5.81)
RDW: 14.9 % (ref 11.5–15.5)
WBC Morphology: INCREASED
WBC: 18.9 10*3/uL — ABNORMAL HIGH (ref 4.0–10.5)
nRBC: 0 % (ref 0.0–0.2)

## 2019-04-16 LAB — PROCALCITONIN: Procalcitonin: 3.53 ng/mL

## 2019-04-16 LAB — PHOSPHORUS: Phosphorus: 3 mg/dL (ref 2.5–4.6)

## 2019-04-16 LAB — D-DIMER, QUANTITATIVE: D-Dimer, Quant: 0.28 ug/mL-FEU (ref 0.00–0.50)

## 2019-04-16 LAB — MAGNESIUM: Magnesium: 2.1 mg/dL (ref 1.7–2.4)

## 2019-04-16 LAB — C-REACTIVE PROTEIN: CRP: 19.3 mg/dL — ABNORMAL HIGH (ref <1.0)

## 2019-04-16 MED ORDER — METRONIDAZOLE 500 MG PO TABS: 500.0000 mg | ORAL_TABLET | Freq: Three times a day (TID) | ORAL | 0 refills | Status: AC

## 2019-04-16 MED ORDER — METOPROLOL TARTRATE 25 MG PO TABS: 25.0000 mg | ORAL_TABLET | Freq: Two times a day (BID) | ORAL | 1 refills | Status: DC

## 2019-04-16 MED ORDER — ZINC SULFATE 220 (50 ZN) MG PO CAPS: 220.0000 mg | ORAL_CAPSULE | Freq: Every day | ORAL | 0 refills | Status: AC

## 2019-04-16 MED ORDER — ASCORBIC ACID 500 MG PO TABS: 500.0000 mg | ORAL_TABLET | Freq: Every day | ORAL | 0 refills | Status: AC

## 2019-04-16 MED ORDER — HYDRALAZINE HCL 25 MG PO TABS
25.0000 mg | ORAL_TABLET | Freq: Three times a day (TID) | ORAL | Status: DC
  Administered 2019-04-16 (×2): 25 mg via ORAL
  Filled 2019-04-16 (×2): qty 1

## 2019-04-16 MED ORDER — DEXAMETHASONE 4 MG PO TABS: 6.0000 mg | ORAL_TABLET | Freq: Every day | ORAL | Status: DC

## 2019-04-16 MED ORDER — HYDRALAZINE HCL 25 MG PO TABS: 25.0000 mg | ORAL_TABLET | Freq: Three times a day (TID) | ORAL | 0 refills | Status: DC

## 2019-04-16 MED ORDER — ENOXAPARIN SODIUM 80 MG/0.8ML ~~LOC~~ SOLN: 80.0000 mg | SUBCUTANEOUS | Status: DC

## 2019-04-16 MED ORDER — DEXAMETHASONE 6 MG PO TABS: 6.0000 mg | ORAL_TABLET | Freq: Every day | ORAL | 0 refills | Status: AC

## 2019-04-16 NOTE — Progress Notes (Signed)
PROGRESS NOTE    Dustin Young  GEX:[id number] DOB: 1980-08-12 DOA: 04/14/2019 PCP: Nonnie Done., MD    Brief Narrative:  Dustin Young is a 38 year old male with history of morbid obesity, BMI 44.7, hypertension not on medication who presented to the ED for diarrhea for the past 24 hours PTA.  Patient reported no known Covid exposures.  Reported multiple loose watery stools, generalized abdominal discomfort, associated chills.  No vomiting.  Diarrhea worsens on eating.  In ED, febrile 102.9 F.  BP elevated 160s to 180s, tachycardia heart rate in 120s, tachypnea 19-30. Hypoxia O2 sats 85% on room air, was placed on 3 L O2. Labs showed AKI creatinine 1.6, lactic acid 1.2, sodium 131.  EDP referred patient for admission for hypoxia and acute renal failure and further work-up and treatment.   Assessment & Plan:   Principal Problem:   Acute respiratory failure with hypoxia (HCC) Active Problems:   Pneumonia due to COVID-19 virus   AKI (acute kidney injury) (HCC)   Essential hypertension   Diarrhea   Obesity   Acute Hypoxic Respiratory Failure due to Acute Covid 19 Viral Pneumonia during the ongoing 2020 Covid 19 Pandemic, with sepsis- POA Patient presenting with sepsis criteria with fever of 102.9, tachypnea, tachycardia, hypoxia with acute renal failure with a creatinine of 1.6.  Etiology likely secondary to acute Covid-19 viral illness associated with underlying gastroenteritis.  Was noted to be on hypoxic on presentation with SPO2 85%.  Mild hazy opacity right lung base which may be due to developing pneumonia. --Continue remdesivir, plan 5-day course - Day #  --Solu-Medrol 40 mg BID, transition to decadron 6mg  on 12/28; plan 7-10d course --Now titrated off of supplemental oxygen --Supportive care with vitamin C, zinc, albuterol, Tylenol --Continue to monitor oxygen saturations closely --Monitor CMP, CBC, CRP, D-dimer, ferritin, PCT daily --Continue airborne/contact  isolation  Gastroenteritis, likely related to viral from Covid-19 infection versus bacterial Etiology likely secondary to Covid-19 infection, although reports suspicious intake with spaghetti the night prior to presentation.  C. difficile negative.  Now afebrile. --WBC 6.3-->20.6-->18.9 --GI PCR panel: Pending --Continue Flagyl 500 mg IV q8h --Supportive care, continue to monitor bowel movements closely  Acute renal failure Creatinine 1.66 on admission.  No previous baseline in EMR.  Suspect prerenal azotemia from dehydration, diarrhea related to Covid-19 infection as above. --Cr 1.66-->1.65-->1.43 --continue IVF w/ NS at 178mL/hr --Avoid nephrotoxins, renally dose all medications --follow renal function daily  Morbid obesity  Body mass index is 47.27 kg/m. Discussed with patient need for aggressive weight loss measures and lifestyle changes as this complicates all facets of care. --May benefit from outpatient sleep study, suspicion for OSA   DVT prophylaxis: Lovenox Code Status: Full code Family Communication: None present at bedside Disposition Plan: Anticipate discharge home likely tomorrow with follow-up in [facility] infusion center for completion of remdesivir   Consultants:   none  Procedures:   none  Antimicrobials:   Remdesivir 12/25>>  Flagyl 12/26>>   Subjective: Patient seen and examined bedside, resting comfortably.  Now titrated off of supplemental oxygen.  Asking when he can discharge home.  Also reports poor sleep overnight.  Discussed with him that we need to set up from the severe infusion at [facility] clinic prior to discharge; and no unavailable today at clinic for appointment scheduling.  No other complaints or concerns at this time.  Reports diarrhea has now resolved.  Denies headache, no fever/chills/night sweats, no nausea cefonicid/diarrhea, no chest pain, no shortness of breath,  no abdominal pain, no weakness, no fatigue, no paresthesias.  No acute concerns  per nursing staff.  Objective: Vitals:   04/15/19 2159 04/16/19 0546 04/16/19 0814 04/16/19 1240  BP: (!) 158/79 (!) 173/120 (!) 173/119 (!) 169/109  Pulse: (!) 102 95  89  Resp: 14 20  18   Temp: 97.7 F (36.5 C) 98.4 F (36.9 C)  98.7 F (37.1 C)  TempSrc: Oral Oral  Oral  SpO2: 96% 97%  96%  Weight:      Height:        Intake/Output Summary (Last 24 hours) at 04/16/2019 1359 Last data filed at 04/16/2019 1321 Gross per 24 hour  Intake 2905.33 ml  Output 0 ml  Net 2905.33 ml   Filed Weights   04/14/19 1119 04/14/19 2000  Weight: (!) 149.7 kg (!) 158.1 kg    Examination:  General exam: Appears calm and comfortable  Respiratory system: Clear to auscultation. Respiratory effort normal. Cardiovascular system: S1 & S2 heard, RRR. No JVD, murmurs, rubs, gallops or clicks. No pedal edema. Gastrointestinal system: Abdomen is nondistended, protuberant abdomen, soft and nontender. No organomegaly or masses felt. Normal bowel sounds heard. Central nervous system: Alert and oriented. No focal neurological deficits. Extremities: Symmetric 5 x 5 power. Skin: No rashes, lesions or ulcers Psychiatry: Judgement and insight appear normal. Mood & affect appropriate.     Data Reviewed: I have personally reviewed following labs and imaging studies  CBC: Recent Labs  Lab 04/14/19 1134 04/15/19 0206 04/16/19 0415  WBC 6.3 20.6* 18.9*  NEUTROABS 5.2 19.4* 17.6*  HGB 14.3 15.0 14.4  HCT 43.5 45.7 43.4  MCV 81.9 84.2 81.9  PLT 307 327 330   Basic Metabolic Panel: Recent Labs  Lab 04/14/19 1134 04/15/19 0206 04/16/19 0415  NA 131* 136 135  K 3.8 4.4 4.1  CL 97* 103 99  CO2 23 23 20*  GLUCOSE 106* 127* 130*  BUN 13 12 17   CREATININE 1.66* 1.65* 1.43*  CALCIUM 8.5* 8.5* 8.2*  MG  --  1.9 2.1  PHOS  --  4.0 3.0   GFR: Estimated Creatinine Clearance: 110.9 mL/min (A) (by C-G formula based on SCr of 1.43 mg/dL (H)). Liver Function Tests: Recent Labs  Lab 04/14/19 1134  04/15/19 0206 04/16/19 0415  AST 27 41 39  ALT 26 27 28   ALKPHOS 48 45 46  BILITOT 0.8 0.7 0.8  PROT 7.8 8.1 7.9  ALBUMIN 4.0 3.8 3.7   Recent Labs  Lab 04/14/19 1220  LIPASE 39   No results for input(s): AMMONIA in the last 168 hours. Coagulation Profile: No results for input(s): INR, PROTIME in the last 168 hours. Cardiac Enzymes: No results for input(s): CKTOTAL, CKMB, CKMBINDEX, TROPONINI in the last 168 hours. BNP (last 3 results) No results for input(s): PROBNP in the last 8760 hours. HbA1C: Recent Labs    04/15/19 0206  HGBA1C 5.6   CBG: Recent Labs  Lab 04/14/19 1213  GLUCAP 93   Lipid Profile: Recent Labs    04/14/19 1546  TRIG 73   Thyroid Function Tests: Recent Labs    04/15/19 0206  TSH 2.478   Anemia Panel: Recent Labs    04/15/19 0206 04/16/19 0415  FERRITIN 189 273   Sepsis Labs: Recent Labs  Lab 04/14/19 1134 04/14/19 1546 04/15/19 0206 04/16/19 0415  PROCALCITON  --  0.55 4.57 3.53  LATICACIDVEN 1.1  1.2  --   --   --     Recent Results (from the past  240 hour(s))  Blood culture (routine x 2)     Status: None (Preliminary result)   Collection Time: 04/14/19 12:20 PM   Specimen: BLOOD  Result Value Ref Range Status   Specimen Description   Final    BLOOD RIGHT ANTECUBITAL Performed at [facility] [address], [place], [place] [address]    Special Requests   Final    BOTTLES DRAWN AEROBIC AND ANAEROBIC Blood Culture results may not be optimal due to an inadequate volume of blood received in culture bottles Performed at [facility] [address], [place], [place] [address]    Culture   Final    NO GROWTH 2 DAYS Performed at [facility] [address], [place], [place] [address]    Report Status PENDING  Incomplete  C difficile quick scan w PCR reflex     Status: None   Collection Time: 04/14/19  1:33 PM   Specimen: STOOL  Result Value Ref Range Status   C Diff  antigen NEGATIVE NEGATIVE Final   C Diff toxin NEGATIVE NEGATIVE Final   C Diff interpretation No C. difficile detected.  Final    Comment: Performed at [facility], [place] [address] [place], [place] [address]  MRSA PCR Screening     Status: None   Collection Time: 04/14/19  4:54 PM   Specimen: Nasal Mucosa; Nasopharyngeal  Result Value Ref Range Status   MRSA by PCR NEGATIVE NEGATIVE Final    Comment:        The GeneXpert MRSA Assay (FDA approved for NASAL specimens only), is one component of a comprehensive MRSA colonization surveillance program. It is not intended to diagnose MRSA infection nor to guide or monitor treatment for MRSA infections. Performed at [facility], [place] [address], [place], [place] [address]          Radiology Studies: No results found.      Scheduled Meds: . vitamin C  500 mg Oral Daily  . Chlorhexidine Gluconate Cloth  6 each Topical Daily  . [START ON 04/17/2019] dexamethasone  6 mg Oral Daily  . enoxaparin (LOVENOX) injection  80 mg Subcutaneous Q24H  . fentaNYL (SUBLIMAZE) injection  50 mcg Intravenous Once  . hydrALAZINE  25 mg Oral Q8H  . mouth rinse  15 mL Mouth Rinse BID  . methylPREDNISolone (SOLU-MEDROL) injection  40 mg Intravenous BID  . metoprolol tartrate  25 mg Oral BID  . ondansetron (ZOFRAN) IV  4 mg Intravenous Once  . zinc sulfate  220 mg Oral Daily   Continuous Infusions: . sodium chloride 75 mL/hr at 04/16/19 0622  . metronidazole 500 mg (04/16/19 1317)  . remdesivir 100 mg in NS 100 mL 100 mg (04/16/19 0932)     LOS: 2 days    Time spent: 37 minutes spent on chart review, discussion with nursing staff, consultants, updating family and interview/physical exam; more than 50% of that time was spent in counseling and/or coordination of care.    Timaya Bojarski J [place] DO [facility] 04/16/2019, 1:59 PM

## 2019-04-16 NOTE — Progress Notes (Signed)
Patient scheduled for outpatient Remdesivir infusion at 230PM on Monday 12/28 and Tuesday 12/29.  Please advise them to report to [facility] at [address].  Drive to the security guard and tell them you are here for an infusion. They will direct you to the front entrance where we will come and get you.  For questions call [phone].  Thanks

## 2019-04-16 NOTE — Discharge Summary (Signed)
Physician Discharge Summary  SOTA HETZ NWG:[id number] DOB: 1981/04/20 DOA: 04/14/2019  PCP: Nonnie Done., MD  Admit date: 04/14/2019 Discharge date: 04/16/2019  Admitted From:  Disposition:    Recommendations for Outpatient Follow-up:  1. Follow up with PCP in 1 week 2. Please obtain CBC/CMP in one week 3. Patient is scheduled in the outpatient remdesivir infusion clinic at [facility] on 12/28 and 12/29 at 230pm to complete a 5-day course of remdesivir. 4. Continue Decadron 6 mg p.o. daily to complete a 10-day course 5. Started on metoprolol tartrate 25 mg p.o. twice daily and hydralazine 25 mg p.o. 3 times daily for poorly controlled hypertension. 6. Consider outpatient sleep study, suspicion for obstructive sleep apnea 7. Please follow up on the following pending results: GI PCR panel  Home Health: No Equipment/Devices: None  Discharge Condition: Stable CODE STATUS: Full code Diet recommendation: Heart healthy  History of present illness:  Dustin Young is a 38 year old male with history of morbid obesity, BMI 44.7, hypertension not on medication who presented to the ED for diarrhea for the past 24 hours PTA. Patient reported no known Covid exposures. Reported multiple loose watery stools, generalized abdominal discomfort, associated chills. No vomiting. Diarrhea worsens on eating.  In ED, febrile 102.9 F. BP elevated 160s to 180s, tachycardia heart rate in 120s, tachypnea 19-30. Hypoxia O2 sats 85% on room air, was placed on 3 L O2. Labs showed AKI creatinine 1.6, lactic acid 1.2, sodium 131.  EDP referred patient for admission for hypoxia and acute renal failure and further work-up and treatment.  Hospital course:  Acute Hypoxic Respiratory Failure due to Acute Covid 19 Viral Pneumonia during the ongoing 2020 Covid 19 Pandemic, with sepsis- POA Patient presenting with sepsis criteria with fever of 102.9, tachypnea, tachycardia, hypoxia with acute renal failure with  a creatinine of 1.6.  Etiology likely secondary to acute Covid-19 viral illness associated with underlying gastroenteritis.  Was noted to be on hypoxic on presentation with SPO2 85%.  Mild hazy opacity right lung base which may be due to developing pneumonia.  Patient was started on IV steroids and remdesivir.  He was supported with vitamin C, zinc, albuterol as needed and Tylenol as needed.  His supplemental oxygen was titrated off at time of discharge.  Patient will discharge home today to continue remdesivir infusions in the outpatient [facility] clinic scheduled on 04/17/2019 and 04/18/2019 at 2:30 PM, to complete a 5-day course.  Also will continue Decadron 6 mg p.o. daily to complete a 10-day course.  Gastroenteritis, likely related to viral from Covid-19 infection versus bacterial Etiology likely secondary to Covid-19 infection, although reports suspicious intake with spaghetti the night prior to presentation.  C. difficile negative.  Now afebrile.  WBC count trended down from 20.6-18.9 at time of discharge.  GI PCR panel was pending.  Patient was noted to start IV Flagyl and will continue Flagyl 500 mg p.o. every 8 hours to complete a 14-day course.  Follow-up GI PCR panel outpatient.  Acute renal failure Creatinine 1.66 on admission.  No previous baseline in EMR.  Suspect prerenal azotemia from dehydration, diarrhea related to Covid-19 infection as above.  Creatinine improved to 1.43 at time of discharge.  Recommend repeat renal function in 1 week following discharge.  Morbid obesity  Body mass index is 47.27 kg/m. Discussed with patient need for aggressive weight loss measures and lifestyle changes as this complicates all facets of care. May benefit from outpatient sleep study, suspicion for OSA.  Discharge Diagnoses:  Active Problems:   Pneumonia due to COVID-19 virus   AKI (acute kidney injury) [facility])   Essential hypertension   Obesity    Discharge Instructions  Discharge Instructions     Call MD for:  extreme fatigue   Complete by: As directed    Call MD for:  persistant dizziness or light-headedness   Complete by: As directed    Call MD for:  persistant nausea and vomiting   Complete by: As directed    Call MD for:  severe uncontrolled pain   Complete by: As directed    Call MD for:  temperature >100.4   Complete by: As directed    Diet - low sodium heart healthy   Complete by: As directed    Increase activity slowly   Complete by: As directed      Allergies as of 04/16/2019   No Known Allergies     Medication List    TAKE these medications   ascorbic acid 500 MG tablet Commonly known as: VITAMIN C Take 1 tablet (500 mg total) by mouth daily. Start taking on: April 17, 2019   dexamethasone 6 MG tablet Commonly known as: DECADRON Take 1 tablet (6 mg total) by mouth daily for 8 days. Start taking on: April 17, 2019   hydrALAZINE 25 MG tablet Commonly known as: APRESOLINE Take 1 tablet (25 mg total) by mouth every 8 (eight) hours.   metoprolol tartrate 25 MG tablet Commonly known as: LOPRESSOR Take 1 tablet (25 mg total) by mouth 2 (two) times daily.   metroNIDAZOLE 500 MG tablet Commonly known as: Flagyl Take 1 tablet (500 mg total) by mouth 3 (three) times daily for 12 days.   zinc sulfate 220 (50 Zn) MG capsule Take 1 capsule (220 mg total) by mouth daily. Start taking on: April 17, 2019      Follow-up Information    Slatosky, Excell Seltzer., MD. Schedule an appointment as soon as possible for a visit in 1 week(s).   Specialty: Family Medicine Contact information: [address] [place] [address] [phone]        [facility] Follow up on 04/17/2019.   Why: Patient scheduled for outpatient Remdesivir infusion at 230PM on Monday 12/28 and Tuesday 12/29.  Please advise them to report to [facility] at [address]  Drive to the security guard and tell them you are here for an infusion. Contact  information: [address] [place] [address] [phone]         No Known Allergies  Consultations:  none   Procedures/Studies: CT ABDOMEN PELVIS W CONTRAST  Result Date: 04/14/2019 CLINICAL DATA:  Diarrhea for 24 hours. COVID-19 positive EXAM: CT ABDOMEN AND PELVIS WITH CONTRAST TECHNIQUE: Multidetector CT imaging of the abdomen and pelvis was performed using the standard protocol following bolus administration of intravenous contrast. CONTRAST:  80mL OMNIPAQUE IOHEXOL 300 MG/ML  SOLN COMPARISON:  None. FINDINGS: Lower chest: Innumerable nodular and masslike opacities are identified in the visualized lung bases. There is no pleural effusion. The heart size is normal. Hepatobiliary: No focal liver abnormality is seen. No gallstones, gallbladder wall thickening, or biliary dilatation. Pancreas: Unremarkable. No pancreatic ductal dilatation or surrounding inflammatory changes. Spleen: Normal in size without focal abnormality. Adrenals/Urinary Tract: Adrenal glands are unremarkable. Kidneys are normal, without renal calculi, focal lesion, or hydronephrosis. Bladder is unremarkable. Stomach/Bowel: Stomach is within normal limits. Appendix appears normal. No evidence of bowel wall thickening, distention, or  inflammatory changes. Vascular/Lymphatic: No significant vascular findings are present. No enlarged abdominal or pelvic lymph nodes. Reproductive: Prostate is unremarkable. Other: Small umbilical herniation of mesenteric fat is identified. Musculoskeletal: No acute or significant osseous findings. IMPRESSION: 1. No acute abnormality identified in the abdomen and pelvis. 2. Innumerable nodular and masslike opacities are identified in the visualized lung bases. The findings are nonspecific but could be due to infectious/inflammatory etiology. Electronically Signed   By: Sherian ReinWei-Chen  Lin M.D.   On: 04/14/2019 14:30   DG Chest Port 1 View  Result Date: 04/14/2019 CLINICAL  DATA:  Diarrhea for 24 hours.  COVID-19 positive. EXAM: PORTABLE CHEST 1 VIEW COMPARISON:  None. FINDINGS: The heart size and mediastinal contours are within normal limits. Probable mild hazy opacity identified in the right lung base. There is no pulmonary edema or pleural effusion. The visualized skeletal structures are unremarkable. IMPRESSION: Probable mild hazy opacity identified in the right lung base which may be due to developing pneumonia. Electronically Signed   By: Sherian ReinWei-Chen  Lin M.D.   On: 04/14/2019 12:46      Subjective:   Patient seen and examined bedside, resting comfortably.  Now titrated off of supplemental oxygen. Asking when he can discharge home.  Also reports poor sleep overnight.    From severe infusion has been schedule [facility] on 12/28 and 12/29 at 230pm.  No other complaints or concerns at this time.  Reports diarrhea has now resolved.  Denies headache, no fever/chills/night sweats, no nausea/vomiting/diarrhea, no chest pain, no shortness of breath, no abdominal pain, no weakness, no fatigue, no paresthesias.  No acute concerns per nursing staff.  Discharge Exam: Vitals:   04/16/19 0814 04/16/19 1240  BP: (!) 173/119 (!) 169/109  Pulse:  89  Resp:  18  Temp:  98.7 F (37.1 C)  SpO2:  96%   Vitals:   04/15/19 2159 04/16/19 0546 04/16/19 0814 04/16/19 1240  BP: (!) 158/79 (!) 173/120 (!) 173/119 (!) 169/109  Pulse: (!) 102 95  89  Resp: 14 20  18   Temp: 97.7 F (36.5 C) 98.4 F (36.9 C)  98.7 F (37.1 C)  TempSrc: Oral Oral  Oral  SpO2: 96% 97%  96%  Weight:      Height:        General: Pt is alert, awake, not in acute distress, obese Cardiovascular: RRR, S1/S2 +, no rubs, no gallops Respiratory: CTA bilaterally, no wheezing, no rhonchi, oxygenating well on room air Abdominal: Soft, NT, ND, bowel sounds + Extremities: no edema, no cyanosis    The results of significant diagnostics from this hospitalization (including imaging, microbiology,  ancillary and laboratory) are listed below for reference.     Microbiology: Recent Results (from the past 240 hour(s))  Blood culture (routine x 2)     Status: None (Preliminary result)   Collection Time: 04/14/19 12:20 PM   Specimen: BLOOD  Result Value Ref Range Status   Specimen Description   Final    BLOOD RIGHT ANTECUBITAL Performed at [facility], [address] [place], [place] [id number]    Special Requests   Final    BOTTLES DRAWN AEROBIC AND ANAEROBIC Blood Culture results may not be optimal due to an inadequate volume of blood received in culture bottles Performed at [facility], [address] [place], [place] [address]    Culture   Final    NO GROWTH 2 DAYS Performed at [facility], [address] [place],  [place] [address]    Report Status PENDING  Incomplete  C difficile quick scan w PCR reflex     Status: None   Collection Time: 04/14/19  1:33 PM   Specimen: STOOL  Result Value Ref Range Status   C Diff antigen NEGATIVE NEGATIVE Final   C Diff toxin NEGATIVE NEGATIVE Final   C Diff interpretation No C. difficile detected.  Final    Comment: Performed at [facility], [address], [place], [place] [address]  MRSA PCR Screening     Status: None   Collection Time: 04/14/19  4:54 PM   Specimen: Nasal Mucosa; Nasopharyngeal  Result Value Ref Range Status   MRSA by PCR NEGATIVE NEGATIVE Final    Comment:        The GeneXpert MRSA Assay (FDA approved for NASAL specimens only), is one component of a comprehensive MRSA colonization surveillance program. It is not intended to diagnose MRSA infection nor to guide or monitor treatment for MRSA infections. Performed at [facility], [address] [place], [place] [address]      Labs: BNP (last 3 results) No results for input(s): BNP in the last 8760 hours. Basic Metabolic Panel: Recent Labs  Lab 04/14/19 1134  04/15/19 0206 04/16/19 0415  NA 131* 136 135  K 3.8 4.4 4.1  CL 97* 103 99  CO2 23 23 20*  GLUCOSE 106* 127* 130*  BUN 13 12 17   CREATININE 1.66* 1.65* 1.43*  CALCIUM 8.5* 8.5* 8.2*  MG  --  1.9 2.1  PHOS  --  4.0 3.0   Liver Function Tests: Recent Labs  Lab 04/14/19 1134 04/15/19 0206 04/16/19 0415  AST 27 41 39  ALT 26 27 28   ALKPHOS 48 45 46  BILITOT 0.8 0.7 0.8  PROT 7.8 8.1 7.9  ALBUMIN 4.0 3.8 3.7   Recent Labs  Lab 04/14/19 1220  LIPASE 39   No results for input(s): AMMONIA in the last 168 hours. CBC: Recent Labs  Lab 04/14/19 1134 04/15/19 0206 04/16/19 0415  WBC 6.3 20.6* 18.9*  NEUTROABS 5.2 19.4* 17.6*  HGB 14.3 15.0 14.4  HCT 43.5 45.7 43.4  MCV 81.9 84.2 81.9  PLT 307 327 330   Cardiac Enzymes: No results for input(s): CKTOTAL, CKMB, CKMBINDEX, TROPONINI in the last 168 hours. BNP: Invalid input(s): POCBNP CBG: Recent Labs  Lab 04/14/19 1213  GLUCAP 93   D-Dimer Recent Labs    04/15/19 0206 04/16/19 0415  DDIMER 0.83* 0.28   Hgb A1c Recent Labs    04/15/19 0206  HGBA1C 5.6   Lipid Profile Recent Labs    04/14/19 1546  TRIG 73   Thyroid function studies Recent Labs    04/15/19 0206  TSH 2.478   Anemia work up Recent Labs    04/15/19 0206 04/16/19 0415  FERRITIN 189 273   Urinalysis    Component Value Date/Time   COLORURINE YELLOW 04/14/2019 1134   APPEARANCEUR CLEAR 04/14/2019 1134   LABSPEC 1.018 04/14/2019 1134   PHURINE 5.0 04/14/2019 1134   GLUCOSEU NEGATIVE 04/14/2019 1134   HGBUR MODERATE (A) 04/14/2019 1134   BILIRUBINUR NEGATIVE 04/14/2019 1134   KETONESUR NEGATIVE 04/14/2019 1134   PROTEINUR 100 (A) 04/14/2019 1134   NITRITE NEGATIVE 04/14/2019 1134   LEUKOCYTESUR NEGATIVE 04/14/2019 1134   Sepsis Labs Invalid input(s): PROCALCITONIN,  WBC,  LACTICIDVEN Microbiology Recent Results (from the past 240 hour(s))  Blood culture (routine x 2)     Status: None (Preliminary result)  Collection  Time: 04/14/19 12:20 PM   Specimen: BLOOD  Result Value Ref Range Status   Specimen Description   Final    BLOOD RIGHT ANTECUBITAL Performed at [facility] [address] [place], [place] [address]    Special Requests   Final    BOTTLES DRAWN AEROBIC AND ANAEROBIC Blood Culture results may not be optimal due to an inadequate volume of blood received in culture bottles Performed at [facility] [address] [place], [place] [address]    Culture   Final    NO GROWTH 2 DAYS Performed at [facility] [address] [place], [place] [address]    Report Status PENDING  Incomplete  C difficile quick scan w PCR reflex     Status: None   Collection Time: 04/14/19  1:33 PM   Specimen: STOOL  Result Value Ref Range Status   C Diff antigen NEGATIVE NEGATIVE Final   C Diff toxin NEGATIVE NEGATIVE Final   C Diff interpretation No C. difficile detected.  Final    Comment: Performed at [facility], [place] [address] [place], [place] [address]  MRSA PCR Screening     Status: None   Collection Time: 04/14/19  4:54 PM   Specimen: Nasal Mucosa; Nasopharyngeal  Result Value Ref Range Status   MRSA by PCR NEGATIVE NEGATIVE Final    Comment:        The GeneXpert MRSA Assay (FDA approved for NASAL specimens only), is one component of a comprehensive MRSA colonization surveillance program. It is not intended to diagnose MRSA infection nor to guide or monitor treatment for MRSA infections. Performed at [facility], [place] [address], [place], [place] [address]      Time coordinating discharge: Over 30 minutes  SIGNED:   Travus Oren J [place] DO  [facility] 04/16/2019, 3:42 PM

## 2019-04-16 NOTE — Discharge Instructions (Signed)
You are scheduled for an outpatient infusion of Remdesivir at 230PM on Monday 12/28 and Tuesday 12/29.  Please report to [facility] at [address]  Drive to the security guard and tell them you are here for an infusion. They will direct you to the front entrance where we will come and get you.  For questions call [phone].  Thanks     COVID-19 COVID-19 is a respiratory infection that is caused by a virus called severe acute respiratory syndrome coronavirus 2 (SARS-CoV-2). The disease is also known as coronavirus disease or novel coronavirus. In some people, the virus may not cause any symptoms. In others, it may cause a serious infection. The infection can get worse quickly and can lead to complications, such as:  Pneumonia, or infection of the lungs.  Acute respiratory distress syndrome or ARDS. This is fluid build-up in the lungs.  Acute respiratory failure. This is a condition in which there is not enough oxygen passing from the lungs to the body.  Sepsis or septic shock. This is a serious bodily reaction to an infection.  Blood clotting problems.  Secondary infections due to bacteria or fungus. The virus that causes COVID-19 is contagious. This means that it can spread from person to person through droplets from coughs and sneezes (respiratory secretions). What are the causes? This illness is caused by a virus. You may catch the virus by:  Breathing in droplets from an infected person's cough or sneeze.  Touching something, like a table or a doorknob, that was exposed to the virus (contaminated) and then touching your mouth, nose, or eyes. What increases the risk? Risk for infection You are more likely to be infected with this virus if you:  Live in or travel to an area with a COVID-19 outbreak.  Come in contact with a sick person who recently traveled to an area with a COVID-19 outbreak.  Provide care for or live with a person who is infected with  COVID-19. Risk for serious illness You are more likely to become seriously ill from the virus if you:  Are 88 years of age or older.  Have a long-term disease that lowers your body's ability to fight infection (immunocompromised).  Live in a nursing home or long-term care facility.  Have a long-term (chronic) disease such as: ? Chronic lung disease, including chronic obstructive pulmonary disease or asthma ? Heart disease. ? Diabetes. ? Chronic kidney disease. ? Liver disease.  Are obese. What are the signs or symptoms? Symptoms of this condition can range from mild to severe. Symptoms may appear any time from 2 to 14 days after being exposed to the virus. They include:  A fever.  A cough.  Difficulty breathing.  Chills.  Muscle pains.  A sore throat.  Loss of taste or smell. Some people may also have stomach problems, such as nausea, vomiting, or diarrhea. Other people may not have any symptoms of COVID-19. How is this diagnosed? This condition may be diagnosed based on:  Your signs and symptoms, especially if: ? You live in an area with a COVID-19 outbreak. ? You recently traveled to or from an area where the virus is common. ? You provide care for or live with a person who was diagnosed with COVID-19.  A physical exam.  Lab tests, which may include: ? A nasal swab to take a sample of fluid from your nose. ? A throat swab to take a sample of fluid from your throat. ? A  sample of mucus from your lungs (sputum). ? Blood tests.  Imaging tests, which may include, X-rays, CT scan, or ultrasound. How is this treated? At present, there is no medicine to treat COVID-19. Medicines that treat other diseases are being used on a trial basis to see if they are effective against COVID-19. Your health care provider will talk with you about ways to treat your symptoms. For most people, the infection is mild and can be managed at home with rest, fluids, and over-the-counter  medicines. Treatment for a serious infection usually takes places in a hospital intensive care unit (ICU). It may include one or more of the following treatments. These treatments are given until your symptoms improve.  Receiving fluids and medicines through an IV.  Supplemental oxygen. Extra oxygen is given through a tube in the nose, a face mask, or a hood.  Positioning you to lie on your stomach (prone position). This makes it easier for oxygen to get into the lungs.  Continuous positive airway pressure (CPAP) or bi-level positive airway pressure (BPAP) machine. This treatment uses mild air pressure to keep the airways open. A tube that is connected to a motor delivers oxygen to the body.  Ventilator. This treatment moves air into and out of the lungs by using a tube that is placed in your windpipe.  Tracheostomy. This is a procedure to create a hole in the neck so that a breathing tube can be inserted.  Extracorporeal membrane oxygenation (ECMO). This procedure gives the lungs a chance to recover by taking over the functions of the heart and lungs. It supplies oxygen to the body and removes carbon dioxide. Follow these instructions at home: Lifestyle  If you are sick, stay home except to get medical care. Your health care provider will tell you how long to stay home. Call your health care provider before you go for medical care.  Rest at home as told by your health care provider.  Do not use any products that contain nicotine or tobacco, such as cigarettes, e-cigarettes, and chewing tobacco. If you need help quitting, ask your health care provider.  Return to your normal activities as told by your health care provider. Ask your health care provider what activities are safe for you. General instructions  Take over-the-counter and prescription medicines only as told by your health care provider.  Drink enough fluid to keep your urine pale yellow.  Keep all follow-up visits as told  by your health care provider. This is important. How is this prevented?  There is no vaccine to help prevent COVID-19 infection. However, there are steps you can take to protect yourself and others from this virus. To protect yourself:   Do not travel to areas where COVID-19 is a risk. The areas where COVID-19 is reported change often. To identify high-risk areas and travel restrictions, check the CDC travel website: StageSync.si  If you live in, or must travel to, an area where COVID-19 is a risk, take precautions to avoid infection. ? Stay away from people who are sick. ? Wash your hands often with soap and water for 20 seconds. If soap and water are not available, use an alcohol-based hand sanitizer. ? Avoid touching your mouth, face, eyes, or nose. ? Avoid going out in public, follow guidance from your state and local health authorities. ? If you must go out in public, wear a cloth face covering or face mask. ? Disinfect objects and surfaces that are frequently touched every day. This may  include:  Counters and tables.  Doorknobs and light switches.  Sinks and faucets.  Electronics, such as phones, remote controls, keyboards, computers, and tablets. To protect others: If you have symptoms of COVID-19, take steps to prevent the virus from spreading to others.  If you think you have a COVID-19 infection, contact your health care provider right away. Tell your health care team that you think you may have a COVID-19 infection.  Stay home. Leave your house only to seek medical care. Do not use public transport.  Do not travel while you are sick.  Wash your hands often with soap and water for 20 seconds. If soap and water are not available, use alcohol-based hand sanitizer.  Stay away from other members of your household. Let healthy household members care for children and pets, if possible. If you have to care for children or pets, wash your hands often and wear a  mask. If possible, stay in your own room, separate from others. Use a different bathroom.  Make sure that all people in your household wash their hands well and often.  Cough or sneeze into a tissue or your sleeve or elbow. Do not cough or sneeze into your hand or into the air.  Wear a cloth face covering or face mask. Where to find more information  Centers for Disease Control and Prevention: StickerEmporium.tn  World Health Organization: https://thompson-craig.com/ Contact a health care provider if:  You live in or have traveled to an area where COVID-19 is a risk and you have symptoms of the infection.  You have had contact with someone who has COVID-19 and you have symptoms of the infection. Get help right away if:  You have trouble breathing.  You have pain or pressure in your chest.  You have confusion.  You have bluish lips and fingernails.  You have difficulty waking from sleep.  You have symptoms that get worse. These symptoms may represent a serious problem that is an emergency. Do not wait to see if the symptoms will go away. Get medical help right away. Call your local emergency services (911 in the [place] Do not drive yourself to the hospital. Let the emergency medical personnel know if you think you have COVID-19. Summary  COVID-19 is a respiratory infection that is caused by a virus. It is also known as coronavirus disease or novel coronavirus. It can cause serious infections, such as pneumonia, acute respiratory distress syndrome, acute respiratory failure, or sepsis.  The virus that causes COVID-19 is contagious. This means that it can spread from person to person through droplets from coughs and sneezes.  You are more likely to develop a serious illness if you are 53 years of age or older, have a weak immunity, live in a nursing home, or have chronic disease.  There is no medicine to treat COVID-19. Your health care provider  will talk with you about ways to treat your symptoms.  Take steps to protect yourself and others from infection. Wash your hands often and disinfect objects and surfaces that are frequently touched every day. Stay away from people who are sick and wear a mask if you are sick. This information is not intended to replace advice given to you by your health care provider. Make sure you discuss any questions you have with your health care provider. Document Released: 05/12/2018 Document Revised: 09/01/2018 Document Reviewed: 05/12/2018 Elsevier Patient Education  2020 Elsevier Inc.  COVID-19: How to Protect Yourself and Others Know how it spreads  There is currently no vaccine to prevent coronavirus disease 2019 (COVID-19).  The best way to prevent illness is to avoid being exposed to this virus.  The virus is thought to spread mainly from person-to-person. ? Between people who are in close contact with one another (within about 6 feet). ? Through respiratory droplets produced when an infected person coughs, sneezes or talks. ? These droplets can land in the mouths or noses of people who are nearby or possibly be inhaled into the lungs. ? Some recent studies have suggested that COVID-19 may be spread by people who are not showing symptoms. Everyone should Clean your hands often  Wash your hands often with soap and water for at least 20 seconds especially after you have been in a public place, or after blowing your nose, coughing, or sneezing.  If soap and water are not readily available, use a hand sanitizer that contains at least 60% alcohol. Cover all surfaces of your hands and rub them together until they feel dry.  Avoid touching your eyes, nose, and mouth with unwashed hands. Avoid close contact  Stay home if you are sick.  Avoid close contact with people who are sick.  Put distance between yourself and other people. ? Remember that some people without symptoms may be able to  spread virus. ? This is especially important for people who are at higher risk of getting very RetroStamps.it Cover your mouth and nose with a cloth face cover when around others  You could spread COVID-19 to others even if you do not feel sick.  Everyone should wear a cloth face cover when they have to go out in public, for example to the grocery store or to pick up other necessities. ? Cloth face coverings should not be placed on young children under age 58, anyone who has trouble breathing, or is unconscious, incapacitated or otherwise unable to remove the mask without assistance.  The cloth face cover is meant to protect other people in case you are infected.  Do NOT use a facemask meant for a Research scientist (physical sciences).  Continue to keep about 6 feet between yourself and others. The cloth face cover is not a substitute for social distancing. Cover coughs and sneezes  If you are in a private setting and do not have on your cloth face covering, remember to always cover your mouth and nose with a tissue when you cough or sneeze or use the inside of your elbow.  Throw used tissues in the trash.  Immediately wash your hands with soap and water for at least 20 seconds. If soap and water are not readily available, clean your hands with a hand sanitizer that contains at least 60% alcohol. Clean and disinfect  Clean AND disinfect frequently touched surfaces daily. This includes tables, doorknobs, light switches, countertops, handles, desks, phones, keyboards, toilets, faucets, and sinks. ktimeonline.com  If surfaces are dirty, clean them: Use detergent or soap and water prior to disinfection.  Then, use a household disinfectant. You can see a list of EPA-registered household disinfectants here. SouthAmericaFlowers.co.uk 08/23/2018 This information is not intended to  replace advice given to you by your health care provider. Make sure you discuss any questions you have with your health care provider. Document Released: 08/02/2018 Document Revised: 08/31/2018 Document Reviewed: 08/02/2018 Elsevier Patient Education  2020 [employer].   COVID-19 Frequently Asked Questions COVID-19 (coronavirus disease) is an infection that is caused by a large family of viruses. Some viruses cause illness in people and others  cause illness in animals like camels, cats, and bats. In some cases, the viruses that cause illness in animals can spread to humans. Where did the coronavirus come from? In December 2019, [place] told the [employer] [facility]) of several cases of lung disease (human respiratory illness). These cases were linked to an open seafood and livestock market in the city of [place]. The link to the seafood and livestock market suggests that the virus may have spread from animals to humans. However, since that first outbreak in December, the virus has also been shown to spread from person to person. What is the name of the disease and the virus? Disease name Early on, this disease was called novel coronavirus. This is because scientists determined that the disease was caused by a new (novel) respiratory virus. The World Health Organization [facility]) has now named the disease COVID-19, or coronavirus disease. Virus name The virus that causes the disease is called severe acute respiratory syndrome coronavirus 2 (SARS-CoV-2). More information on disease and virus naming World Health Organization [facility]): www.who.int/emergencies/diseases/novel-coronavirus-2019/technical-guidance/naming-the-coronavirus-disease-(covid-2019)-and-the-virus-that-causes-it Who is at risk for complications from coronavirus disease? Some people may be at higher risk for complications from coronavirus disease. This includes older adults and people who have chronic diseases, such as heart disease,  diabetes, and lung disease. If you are at higher risk for complications, take these extra precautions:  Avoid close contact with people who are sick or have a fever or cough. Stay at least 3-6 ft (1-2 m) away from them, if possible.  Wash your hands often with soap and water for at least 20 seconds.  Avoid touching your face, mouth, nose, or eyes.  Keep supplies on hand at home, such as food, medicine, and cleaning supplies.  Stay home as much as possible.  Avoid social gatherings and travel. How does coronavirus disease spread? The virus that causes coronavirus disease spreads easily from person to person (is contagious). There are also cases of community-spread disease. This means the disease has spread to:  People who have no known contact with other infected people.  People who have not traveled to areas where there are known cases. It appears to spread from one person to another through droplets from coughing or sneezing. Can I get the virus from touching surfaces or objects? There is still a lot that we do not know about the virus that causes coronavirus disease. Scientists are basing a lot of information on what they know about similar viruses, such as:  Viruses cannot generally survive on surfaces for long. They need a human body (host) to survive.  It is more likely that the virus is spread by close contact with people who are sick (direct contact), such as through: ? Shaking hands or hugging. ? Breathing in respiratory droplets that travel through the air. This can happen when an infected person coughs or sneezes on or near other people.  It is less likely that the virus is spread when a person touches a surface or object that has the virus on it (indirect contact). The virus may be able to enter the body if the person touches a surface or object and then touches his or her face, eyes, nose, or mouth. Can a person spread the virus without having symptoms of the disease? It  may be possible for the virus to spread before a person has symptoms of the disease, but this is most likely not the main way the virus is spreading. It is more likely for the virus to  spread by being in close contact with people who are sick and breathing in the respiratory droplets of a sick person's cough or sneeze. What are the symptoms of coronavirus disease? Symptoms vary from person to person and can range from mild to severe. Symptoms may include:  Fever.  Cough.  Tiredness, weakness, or fatigue.  Fast breathing or feeling short of breath. These symptoms can appear anywhere from 2 to 14 days after you have been exposed to the virus. If you develop symptoms, call your health care provider. People with severe symptoms may need hospital care. If I am exposed to the virus, how long does it take before symptoms start? Symptoms of coronavirus disease may appear anywhere from 2 to 14 days after a person has been exposed to the virus. If you develop symptoms, call your health care provider. Should I be tested for this virus? Your health care provider will decide whether to test you based on your symptoms, history of exposure, and your risk factors. How does a health care provider test for this virus? Health care providers will collect samples to send for testing. Samples may include:  Taking a swab of fluid from the nose.  Taking fluid from the lungs by having you cough up mucus (sputum) into a sterile cup.  Taking a blood sample.  Taking a stool or urine sample. Is there a treatment or vaccine for this virus? Currently, there is no vaccine to prevent coronavirus disease. Also, there are no medicines like antibiotics or antivirals to treat the virus. A person who becomes sick is given supportive care, which means rest and fluids. A person may also relieve his or her symptoms by using over-the-counter medicines that treat sneezing, coughing, and runny nose. These are the same medicines that  a person takes for the common cold. If you develop symptoms, call your health care provider. People with severe symptoms may need hospital care. What can I do to protect myself and my family from this virus?     You can protect yourself and your family by taking the same actions that you would take to prevent the spread of other viruses. Take the following actions:  Wash your hands often with soap and water for at least 20 seconds. If soap and water are not available, use alcohol-based hand sanitizer.  Avoid touching your face, mouth, nose, or eyes.  Cough or sneeze into a tissue, sleeve, or elbow. Do not cough or sneeze into your hand or the air. ? If you cough or sneeze into a tissue, throw it away immediately and wash your hands.  Disinfect objects and surfaces that you frequently touch every day.  Avoid close contact with people who are sick or have a fever or cough. Stay at least 3-6 ft (1-2 m) away from them, if possible.  Stay home if you are sick, except to get medical care. Call your health care provider before you get medical care.  Make sure your vaccines are up to date. Ask your health care provider what vaccines you need. What should I do if I need to travel? Follow travel recommendations from your local health authority, the CDC, and WHO. Travel information and advice  Centers for Disease Control and Prevention (CDC): GeminiCard.glwww.cdc.gov/coronavirus/2019-ncov/travelers/index.html  World Health Organization [facility](WHO): PreviewDomains.sewww.who.int/emergencies/diseases/novel-coronavirus-2019/travel-advice Know the risks and take action to protect your health  You are at higher risk of getting coronavirus disease if you are traveling to areas with an outbreak or if you are exposed to travelers from  areas with an outbreak.  Wash your hands often and practice good hygiene to lower the risk of catching or spreading the virus. What should I do if I am sick? General instructions to stop the spread of  infection  Wash your hands often with soap and water for at least 20 seconds. If soap and water are not available, use alcohol-based hand sanitizer.  Cough or sneeze into a tissue, sleeve, or elbow. Do not cough or sneeze into your hand or the air.  If you cough or sneeze into a tissue, throw it away immediately and wash your hands.  Stay home unless you must get medical care. Call your health care provider or local health authority before you get medical care.  Avoid public areas. Do not take public transportation, if possible.  If you can, wear a mask if you must go out of the house or if you are in close contact with someone who is not sick. Keep your home clean  Disinfect objects and surfaces that are frequently touched every day. This may include: ? Counters and tables. ? Doorknobs and light switches. ? Sinks and faucets. ? Electronics such as phones, remote controls, keyboards, computers, and tablets.  Wash dishes in hot, soapy water or use a dishwasher. Air-dry your dishes.  Wash laundry in hot water. Prevent infecting other household members  Let healthy household members care for children and pets, if possible. If you have to care for children or pets, wash your hands often and wear a mask.  Sleep in a different bedroom or bed, if possible.  Do not share personal items, such as razors, toothbrushes, deodorant, combs, brushes, towels, and washcloths. Where to find more information Centers for Disease Control and Prevention (CDC)  Information and news updates: CardRetirement.cz World Health Organization [facility])  Information and news updates: AffordableSalon.es  Coronavirus health topic: https://thompson-craig.com/  Questions and answers on COVID-19: kruiseway.com  Global tracker: who.sprinklr.com American Academy of Pediatrics (AAP)  Information for families:  www.healthychildren.org/English/health-issues/conditions/chest-lungs/Pages/2019-Novel-Coronavirus.aspx The coronavirus situation is changing rapidly. Check your local health authority website or the CDC and [facility] websites for updates and news. When should I contact a health care provider?  Contact your health care provider if you have symptoms of an infection, such as fever or cough, and you: ? Have been near anyone who is known to have coronavirus disease. ? Have come into contact with a person who is suspected to have coronavirus disease. ? Have traveled outside of the country. When should I get emergency medical care?  Get help right away by calling your local emergency services (911 in the [place] if you have: ? Trouble breathing. ? Pain or pressure in your chest. ? Confusion. ? Blue-tinged lips and fingernails. ? Difficulty waking from sleep. ? Symptoms that get worse. Let the emergency medical personnel know if you think you have coronavirus disease. Summary  A new respiratory virus is spreading from person to person and causing COVID-19 (coronavirus disease).  The virus that causes COVID-19 appears to spread easily. It spreads from one person to another through droplets from coughing or sneezing.  Older adults and those with chronic diseases are at higher risk of disease. If you are at higher risk for complications, take extra precautions.  There is currently no vaccine to prevent coronavirus disease. There are no medicines, such as antibiotics or antivirals, to treat the virus.  You can protect yourself and your family by washing your hands often, avoiding touching your face, and covering your coughs  and sneezes. This information is not intended to replace advice given to you by your health care provider. Make sure you discuss any questions you have with your health care provider. Document Released: 08/02/2018 Document Revised: 08/02/2018 Document Reviewed: 08/02/2018 Elsevier  Patient Education  [place].  Acute Kidney Injury, Adult  Acute kidney injury is a sudden worsening of kidney function. The kidneys are organs that have several jobs. They filter the blood to remove waste products and extra fluid. They also maintain a healthy balance of minerals and hormones in the body, which helps control blood pressure and keep bones strong. With this condition, your kidneys do not do their jobs as well as they should. This condition ranges from mild to severe. Over time it may develop into long-lasting (chronic) kidney disease. Early detection and treatment may prevent acute kidney injury from developing into a chronic condition. What are the causes? Common causes of this condition include:  A problem with blood flow to the kidneys. This may be caused by: ? Low blood pressure (hypotension) or shock. ? Blood loss. ? Heart and blood vessel (cardiovascular) disease. ? Severe burns. ? Liver disease.  Direct damage to the kidneys. This may be caused by: ? Certain medicines. ? A kidney infection. ? Poisoning. ? Being around or in contact with toxic substances. ? A surgical wound. ? A hard, direct hit to the kidney area.  A sudden blockage of urine flow. This may be caused by: ? Cancer. ? Kidney stones. ? An enlarged prostate in males. What are the signs or symptoms? Symptoms of this condition may not be obvious until the condition becomes severe. Symptoms of this condition can include:  Tiredness (lethargy), or difficulty staying awake.  Nausea or vomiting.  Swelling (edema) of the face, legs, ankles, or feet.  Problems with urination, such as: ? Abdominal pain, or pain along the side of your stomach (flank). ? Decreased urine production. ? Decrease in the force of urine flow.  Muscle twitches and cramps, especially in the legs.  Confusion or trouble concentrating.  Loss of appetite.  Fever. How is this diagnosed? This condition may be  diagnosed with tests, including:  Blood tests.  Urine tests.  Imaging tests.  A test in which a sample of tissue is removed from the kidneys to be examined under a microscope (kidney biopsy). How is this treated? Treatment for this condition depends on the cause and how severe the condition is. In mild cases, treatment may not be needed. The kidneys may heal on their own. In more severe cases, treatment will involve:  Treating the cause of the kidney injury. This may involve changing any medicines you are taking or adjusting your dosage.  Fluids. You may need specialized IV fluids to balance your body's needs.  Having a catheter placed to drain urine and prevent blockages.  Preventing problems from occurring. This may mean avoiding certain medicines or procedures that can cause further injury to the kidneys. In some cases treatment may also require:  A procedure to remove toxic wastes from the body (dialysis or continuous renal replacement therapy - CRRT).  Surgery. This may be done to repair a torn kidney, or to remove the blockage from the urinary system. Follow these instructions at home: Medicines  Take over-the-counter and prescription medicines only as told by your health care provider.  Do not take any new medicines without your health care provider's approval. Many medicines can worsen your kidney damage.  Do not take any  vitamin and mineral supplements without your health care provider's approval. Many nutritional supplements can worsen your kidney damage. Lifestyle  If your health care provider prescribed changes to your diet, follow them. You may need to decrease the amount of protein you eat.  Achieve and maintain a healthy weight. If you need help with this, ask your health care provider.  Start or continue an exercise plan. Try to exercise at least 30 minutes a day, 5 days a week.  Do not use any tobacco products, such as cigarettes, chewing tobacco, and  e-cigarettes. If you need help quitting, ask your health care provider. General instructions  Keep track of your blood pressure. Report changes in your blood pressure as told by your health care provider.  Stay up to date with immunizations. Ask your health care provider which immunizations you need.  Keep all follow-up visits as told by your health care provider. This is important. Where to find more information  American Association of Kidney Patients: ResidentialShow.is  [employer]: www.kidney.org  American Kidney Fund: FightingMatch.com.ee  Life Options Rehabilitation Program: ? www.lifeoptions.org ? www.kidneyschool.org Contact a health care provider if:  Your symptoms get worse.  You develop new symptoms. Get help right away if:  You develop symptoms of worsening kidney disease, which include: ? Headaches. ? Abnormally dark or light skin. ? Easy bruising. ? Frequent hiccups. ? Chest pain. ? Shortness of breath. ? End of menstruation in women. ? Seizures. ? Confusion or altered mental status. ? Abdominal or back pain. ? Itchiness.  You have a fever.  Your body is producing less urine.  You have pain or bleeding when you urinate. Summary  Acute kidney injury is a sudden worsening of kidney function.  Acute kidney injury can be caused by problems with blood flow to the kidneys, direct damage to the kidneys, and sudden blockage of urine flow.  Symptoms of this condition may not be obvious until it becomes severe. Symptoms may include edema, lethargy, confusion, nausea or vomiting, and problems passing urine.  This condition can usually be diagnosed with blood tests, urine tests, and imaging tests. Sometimes a kidney biopsy is done to diagnose this condition.  Treatment for this condition often involves treating the underlying cause. It is treated with fluids, medicines, dialysis, diet changes, or surgery. This information is not intended to replace advice  given to you by your health care provider. Make sure you discuss any questions you have with your health care provider. Document Released: 10/20/2010 Document Revised: 03/19/2017 Document Reviewed: 03/27/2016 Elsevier Patient Education  2020 [employer].

## 2019-04-16 NOTE — Progress Notes (Signed)
Discharge instructions and information about quarantining were given to pt and all questions were answered.

## 2019-04-17 ENCOUNTER — Ambulatory Visit (HOSPITAL_COMMUNITY)
Admission: RE | Admit: 2019-04-17 | Discharge: 2019-04-17 | Disposition: A | Discharge status: Home / Self Care | Payer: HRSA Program | Source: Ambulatory Visit | Attending: Pulmonary Disease | Admitting: Pulmonary Disease

## 2019-04-17 DIAGNOSIS — U071 COVID-19: Secondary | ICD-10-CM | POA: Insufficient documentation

## 2019-04-17 DIAGNOSIS — J1289 Other viral pneumonia: Secondary | ICD-10-CM | POA: Insufficient documentation

## 2019-04-17 LAB — GI PATHOGEN PANEL BY PCR, STOOL

## 2019-04-17 MED ORDER — METHYLPREDNISOLONE SODIUM SUCC 125 MG IJ SOLR: 125.0000 mg | Freq: Once | INTRAMUSCULAR | Status: DC | PRN

## 2019-04-17 MED ORDER — SODIUM CHLORIDE 0.9 % IV SOLN
INTRAVENOUS | Status: DC | PRN
  Administered 2019-04-17: 14:00:00 250 mL via INTRAVENOUS

## 2019-04-17 MED ORDER — SODIUM CHLORIDE 0.9 % IV SOLN: 100.0000 mg | Freq: Once | INTRAVENOUS | Status: AC

## 2019-04-17 MED ORDER — SODIUM CHLORIDE 0.9 % IV SOLN
INTRAVENOUS | Status: AC
  Administered 2019-04-17: 100 mg via INTRAVENOUS
  Filled 2019-04-17: qty 20

## 2019-04-17 MED ORDER — ALBUTEROL SULFATE HFA 108 (90 BASE) MCG/ACT IN AERS: 2.0000 | INHALATION_SPRAY | Freq: Once | RESPIRATORY_TRACT | Status: DC | PRN

## 2019-04-17 MED ORDER — FAMOTIDINE IN NACL 20-0.9 MG/50ML-% IV SOLN: 20.0000 mg | Freq: Once | INTRAVENOUS | Status: DC | PRN

## 2019-04-17 MED ORDER — EPINEPHRINE 0.3 MG/0.3ML IJ SOAJ: 0.3000 mg | Freq: Once | INTRAMUSCULAR | Status: DC | PRN

## 2019-04-17 MED ORDER — DIPHENHYDRAMINE HCL 50 MG/ML IJ SOLN: 50.0000 mg | Freq: Once | INTRAMUSCULAR | Status: DC | PRN

## 2019-04-17 NOTE — Progress Notes (Signed)
  Diagnosis: COVID-19  Physician: Dr. Cecille Amsterdam  Procedure: [facility] Med: remdesivir infusion.  Complications: No immediate complications noted.  Discharge: Discharged home   Alcario Drought 04/17/2019

## 2019-04-18 ENCOUNTER — Ambulatory Visit (HOSPITAL_COMMUNITY)
Admit: 2019-04-18 | Discharge: 2019-04-18 | Disposition: A | Discharge status: Home / Self Care | Payer: HRSA Program | Attending: Pulmonary Disease | Admitting: Pulmonary Disease

## 2019-04-18 DIAGNOSIS — U071 COVID-19: Secondary | ICD-10-CM | POA: Diagnosis not present

## 2019-04-18 MED ORDER — SODIUM CHLORIDE 0.9 % IV SOLN
INTRAVENOUS | Status: AC
  Administered 2019-04-18: 14:00:00 100 mg via INTRAVENOUS
  Filled 2019-04-18: qty 20

## 2019-04-18 MED ORDER — ALBUTEROL SULFATE HFA 108 (90 BASE) MCG/ACT IN AERS: 2.0000 | INHALATION_SPRAY | Freq: Once | RESPIRATORY_TRACT | Status: DC | PRN

## 2019-04-18 MED ORDER — FAMOTIDINE IN NACL 20-0.9 MG/50ML-% IV SOLN: 20.0000 mg | Freq: Once | INTRAVENOUS | Status: DC | PRN

## 2019-04-18 MED ORDER — SODIUM CHLORIDE 0.9 % IV SOLN
INTRAVENOUS | Status: DC | PRN
  Administered 2019-04-18: 14:00:00 250 mL via INTRAVENOUS

## 2019-04-18 MED ORDER — DIPHENHYDRAMINE HCL 50 MG/ML IJ SOLN: 50.0000 mg | Freq: Once | INTRAMUSCULAR | Status: DC | PRN

## 2019-04-18 MED ORDER — EPINEPHRINE 0.3 MG/0.3ML IJ SOAJ: 0.3000 mg | Freq: Once | INTRAMUSCULAR | Status: DC | PRN

## 2019-04-18 MED ORDER — SODIUM CHLORIDE 0.9 % IV SOLN: 100.0000 mg | Freq: Once | INTRAVENOUS | Status: AC

## 2019-04-18 MED ORDER — METHYLPREDNISOLONE SODIUM SUCC 125 MG IJ SOLR: 125.0000 mg | Freq: Once | INTRAMUSCULAR | Status: DC | PRN

## 2019-04-18 NOTE — Progress Notes (Signed)
  Diagnosis: COVID-19  Physician: Enid Skeens, MD  Procedure: [facility] Med: remdesivir infusion.  Complications: No immediate complications noted.  Discharge: Discharged home   Gaye Alken 04/18/2019

## 2019-04-19 LAB — CULTURE, BLOOD (ROUTINE X 2): Culture: NO GROWTH

## 2019-04-24 ENCOUNTER — Ambulatory Visit: Payer: MEDICAID | Attending: Internal Medicine

## 2019-04-24 DIAGNOSIS — Z20822 Contact with and (suspected) exposure to covid-19: Secondary | ICD-10-CM

## 2019-04-27 LAB — NOVEL CORONAVIRUS, NAA: SARS-CoV-2, NAA: DETECTED — AB

## 2019-04-29 DIAGNOSIS — Z20828 Contact with and (suspected) exposure to other viral communicable diseases: Secondary | ICD-10-CM | POA: Diagnosis not present

## 2019-08-07 DIAGNOSIS — Z20822 Contact with and (suspected) exposure to covid-19: Secondary | ICD-10-CM | POA: Diagnosis not present

## 2020-05-07 DIAGNOSIS — Z20822 Contact with and (suspected) exposure to covid-19: Secondary | ICD-10-CM | POA: Diagnosis not present

## 2020-06-14 DIAGNOSIS — Z13220 Encounter for screening for lipoid disorders: Secondary | ICD-10-CM | POA: Diagnosis not present

## 2020-06-14 DIAGNOSIS — Z713 Dietary counseling and surveillance: Secondary | ICD-10-CM | POA: Diagnosis not present

## 2020-06-14 DIAGNOSIS — Z6841 Body Mass Index (BMI) 40.0 and over, adult: Secondary | ICD-10-CM | POA: Diagnosis not present

## 2020-06-14 DIAGNOSIS — Z23 Encounter for immunization: Secondary | ICD-10-CM | POA: Diagnosis not present

## 2020-06-14 DIAGNOSIS — R030 Elevated blood-pressure reading, without diagnosis of hypertension: Secondary | ICD-10-CM | POA: Diagnosis not present

## 2020-06-14 DIAGNOSIS — Z136 Encounter for screening for cardiovascular disorders: Secondary | ICD-10-CM | POA: Diagnosis not present

## 2020-08-09 DIAGNOSIS — I10 Essential (primary) hypertension: Secondary | ICD-10-CM | POA: Diagnosis not present

## 2020-08-09 DIAGNOSIS — I160 Hypertensive urgency: Secondary | ICD-10-CM | POA: Diagnosis not present

## 2020-08-09 DIAGNOSIS — N183 Chronic kidney disease, stage 3 unspecified: Secondary | ICD-10-CM | POA: Diagnosis not present

## 2020-08-09 DIAGNOSIS — Z136 Encounter for screening for cardiovascular disorders: Secondary | ICD-10-CM | POA: Diagnosis not present

## 2020-08-09 DIAGNOSIS — E785 Hyperlipidemia, unspecified: Secondary | ICD-10-CM | POA: Diagnosis not present

## 2020-08-12 ENCOUNTER — Other Ambulatory Visit: Payer: Self-pay | Admitting: Internal Medicine

## 2020-08-12 DIAGNOSIS — N183 Chronic kidney disease, stage 3 unspecified: Secondary | ICD-10-CM

## 2020-09-06 DIAGNOSIS — N183 Chronic kidney disease, stage 3 unspecified: Secondary | ICD-10-CM | POA: Diagnosis not present

## 2020-09-06 DIAGNOSIS — I10 Essential (primary) hypertension: Secondary | ICD-10-CM | POA: Diagnosis not present

## 2020-09-06 DIAGNOSIS — E785 Hyperlipidemia, unspecified: Secondary | ICD-10-CM | POA: Diagnosis not present

## 2020-09-06 DIAGNOSIS — N179 Acute kidney failure, unspecified: Secondary | ICD-10-CM | POA: Diagnosis not present

## 2020-10-22 DIAGNOSIS — R600 Localized edema: Secondary | ICD-10-CM | POA: Diagnosis not present

## 2020-10-22 DIAGNOSIS — I10 Essential (primary) hypertension: Secondary | ICD-10-CM | POA: Diagnosis not present

## 2020-10-22 DIAGNOSIS — N183 Chronic kidney disease, stage 3 unspecified: Secondary | ICD-10-CM | POA: Diagnosis not present

## 2020-12-09 DIAGNOSIS — N183 Chronic kidney disease, stage 3 unspecified: Secondary | ICD-10-CM | POA: Diagnosis not present

## 2020-12-09 DIAGNOSIS — I10 Essential (primary) hypertension: Secondary | ICD-10-CM | POA: Diagnosis not present

## 2020-12-09 DIAGNOSIS — E785 Hyperlipidemia, unspecified: Secondary | ICD-10-CM | POA: Diagnosis not present

## 2020-12-10 ENCOUNTER — Other Ambulatory Visit: Payer: Self-pay | Admitting: Internal Medicine

## 2020-12-10 DIAGNOSIS — N183 Chronic kidney disease, stage 3 unspecified: Secondary | ICD-10-CM

## 2020-12-13 DIAGNOSIS — E785 Hyperlipidemia, unspecified: Secondary | ICD-10-CM | POA: Diagnosis not present

## 2020-12-13 DIAGNOSIS — I10 Essential (primary) hypertension: Secondary | ICD-10-CM | POA: Diagnosis not present

## 2020-12-13 DIAGNOSIS — N183 Chronic kidney disease, stage 3 unspecified: Secondary | ICD-10-CM | POA: Diagnosis not present

## 2020-12-27 ENCOUNTER — Ambulatory Visit
Admission: RE | Admit: 2020-12-27 | Discharge: 2020-12-27 | Disposition: A | Discharge status: Home / Self Care | Payer: BC Managed Care – PPO | Source: Ambulatory Visit | Attending: Internal Medicine | Admitting: Internal Medicine

## 2020-12-27 DIAGNOSIS — N183 Chronic kidney disease, stage 3 unspecified: Secondary | ICD-10-CM

## 2021-01-16 IMAGING — CT CT ABD-PELV W/ CM
2 of 4 series · 17 of 46 positions shown, 19 images · IV contrast (omnipaque)
Comparison: None.

CLINICAL DATA: Diarrhea for 24 hours. 5VEZ3-W2 positive

EXAM:
CT ABDOMEN AND PELVIS WITH CONTRAST
TECHNIQUE: Multidetector CT imaging of the abdomen and pelvis was performed
using the standard protocol following bolus administration of
intravenous contrast.
CONTRAST:  80mL OMNIPAQUE IOHEXOL 300 MG/ML  SOLN

[Series 2: axial st · axial · 0.98mm/px · z∈[+1024,+1474]mm · 14 of 102 slices shown, 16 images]
[im 6/102  soft-tissue]
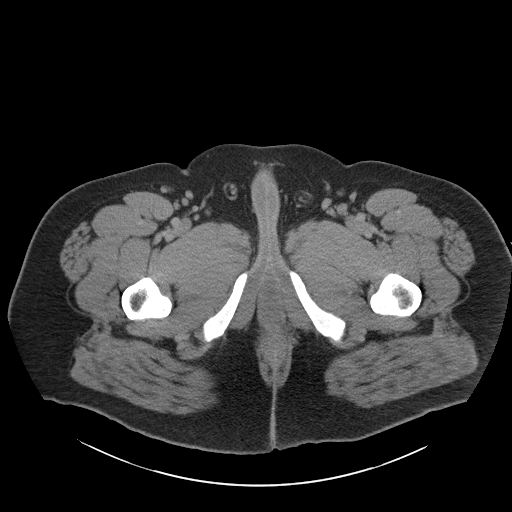
[im 6/102  bone]
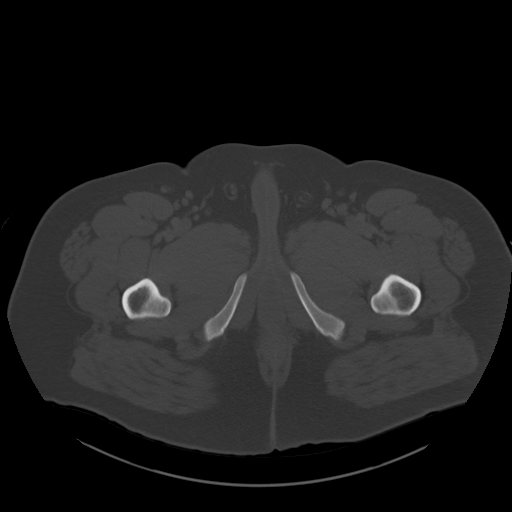
[im 12/102  soft-tissue]
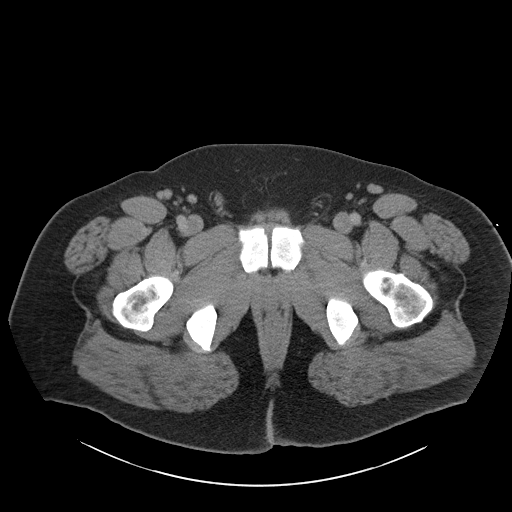
[im 18/102  soft-tissue]
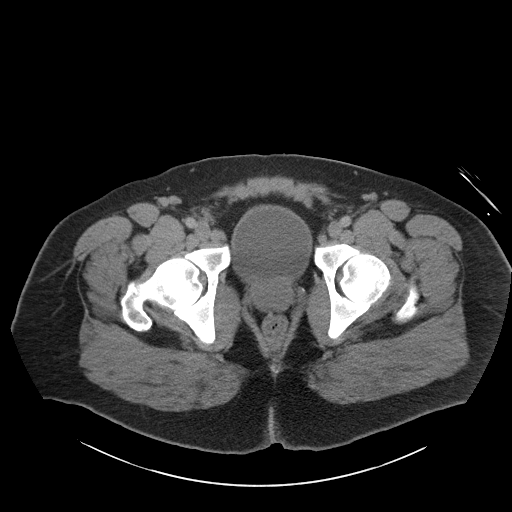
[im 30/102  soft-tissue]
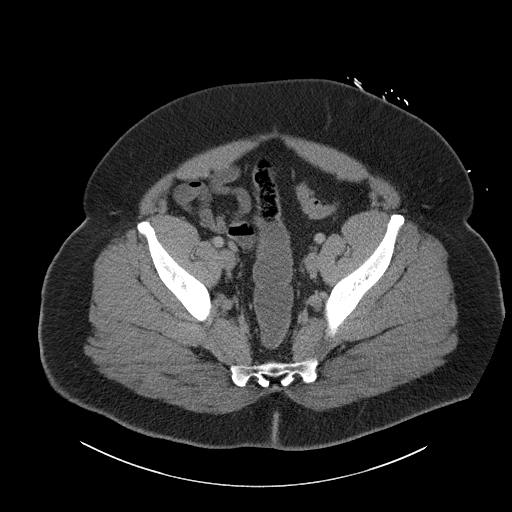
[im 36/102  soft-tissue]
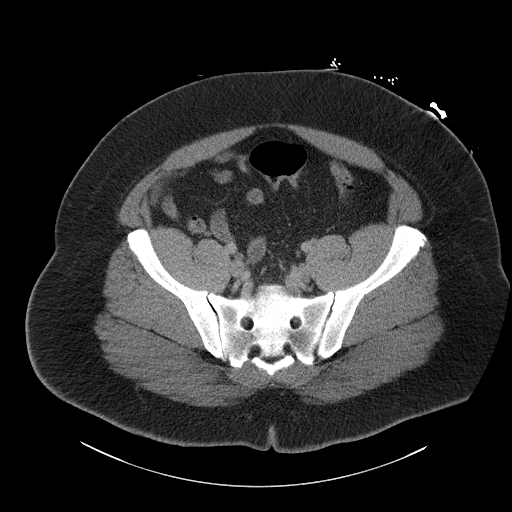
[im 42/102  soft-tissue]
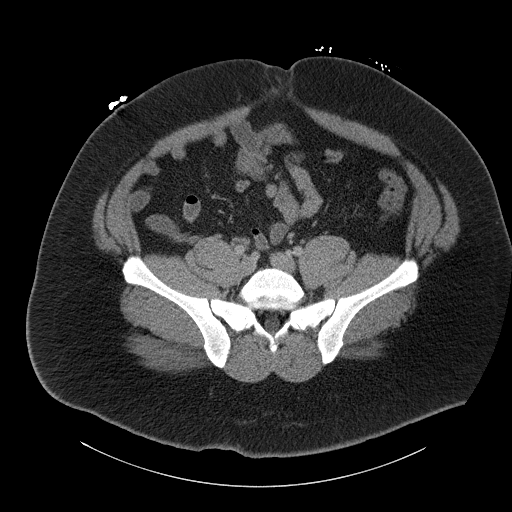
[im 48/102  soft-tissue]
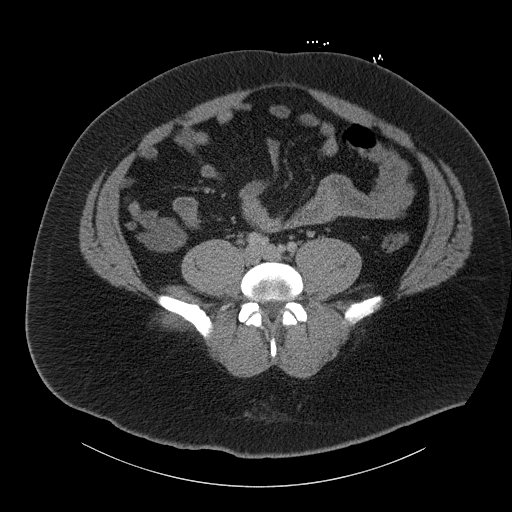
[im 54/102  soft-tissue]
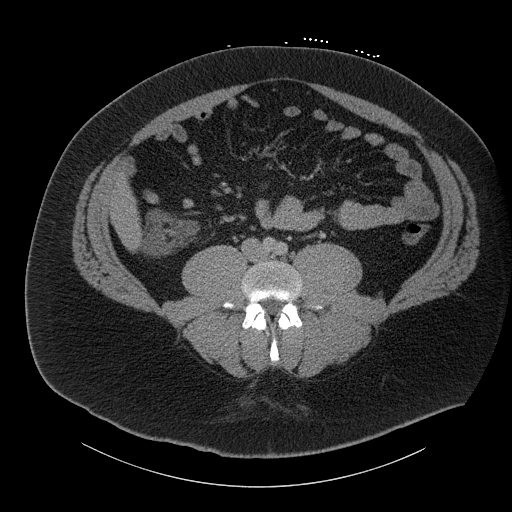
[im 60/102  soft-tissue]
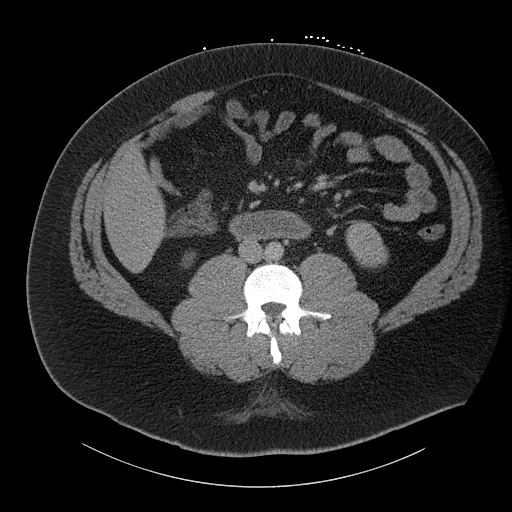
[im 60/102  bone]
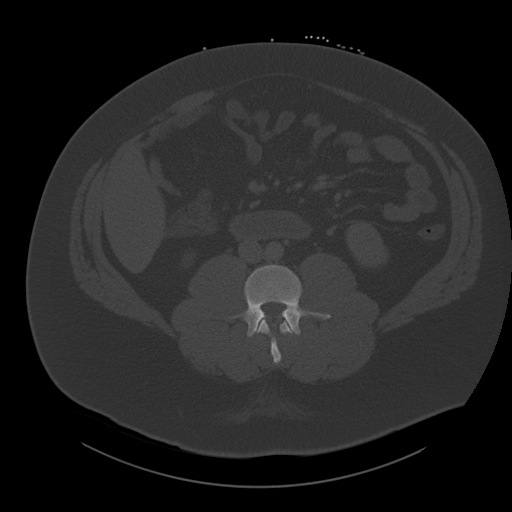
[im 66/102  soft-tissue]
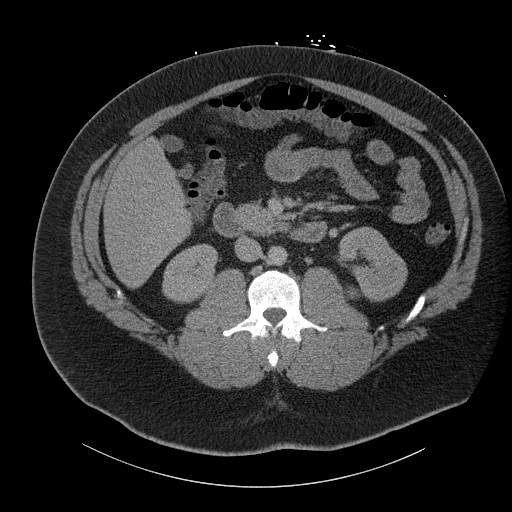
[im 78/102  soft-tissue]
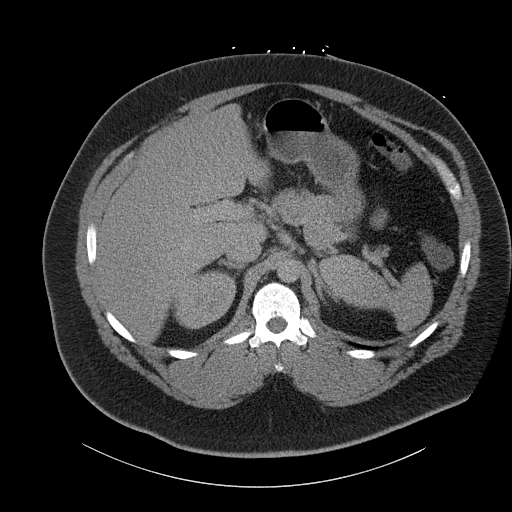
[im 84/102  soft-tissue]
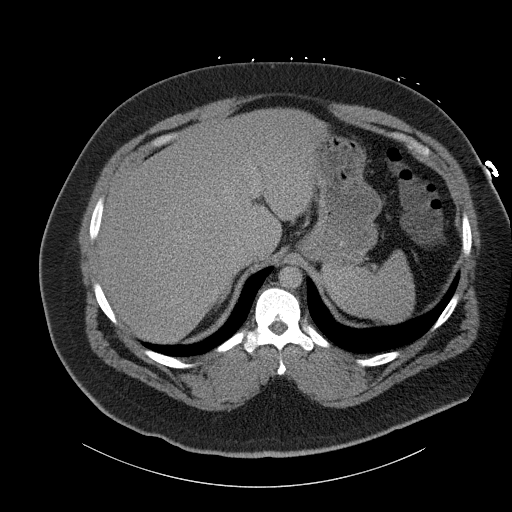
[im 90/102  soft-tissue]
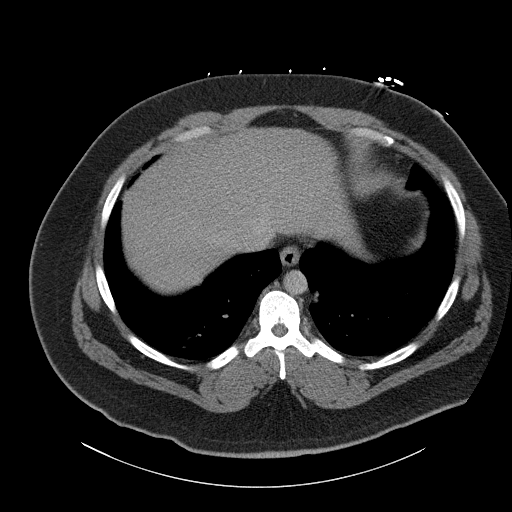
[im 96/102  soft-tissue]
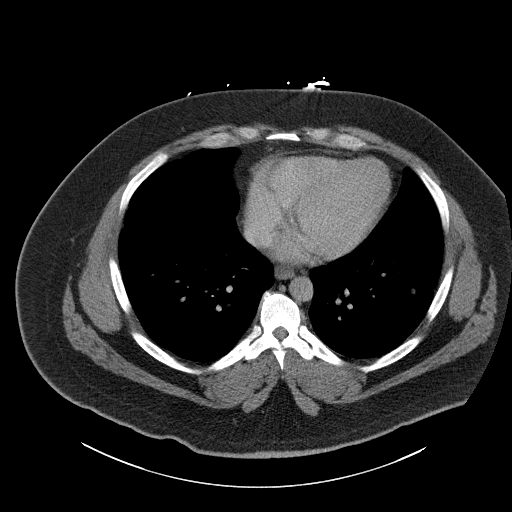

[Series 4: coronal st · coronal · 0.99mm/px · 3 of 218 slices shown]
[im 73/218  soft-tissue]
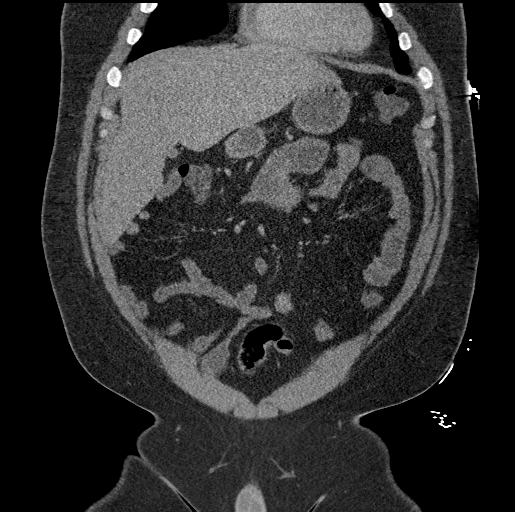
[im 97/218  soft-tissue]
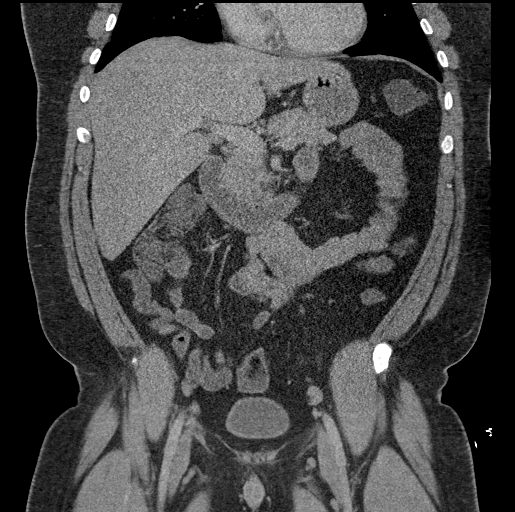
[im 121/218  soft-tissue]
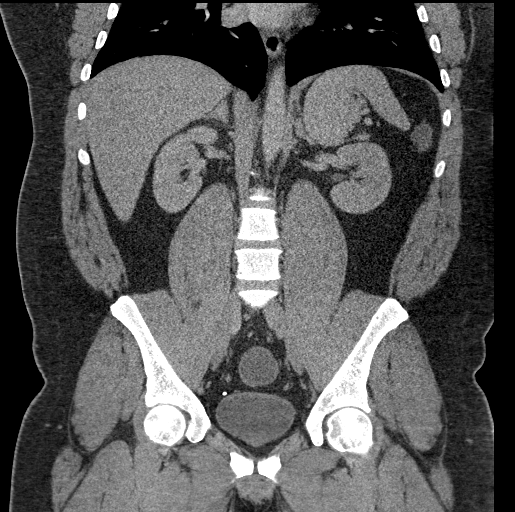

[17 of 46 positions shown; findings below may reference images not displayed]

FINDINGS: Lower chest: Innumerable nodular and masslike opacities are
identified in the visualized lung bases. There is no pleural
effusion. The heart size is normal.

Hepatobiliary: No focal liver abnormality is seen. No gallstones,
gallbladder wall thickening, or biliary dilatation.

Pancreas: Unremarkable. No pancreatic ductal dilatation or
surrounding inflammatory changes.

Spleen: Normal in size without focal abnormality.

Adrenals/Urinary Tract: Adrenal glands are unremarkable. Kidneys are
normal, without renal calculi, focal lesion, or hydronephrosis.
Bladder is unremarkable.

Stomach/Bowel: Stomach is within normal limits. Appendix appears
normal. No evidence of bowel wall thickening, distention, or
inflammatory changes.

Vascular/Lymphatic: No significant vascular findings are present. No
enlarged abdominal or pelvic lymph nodes.

Reproductive: Prostate is unremarkable.

Other: Small umbilical herniation of mesenteric fat is identified.

Musculoskeletal: No acute or significant osseous findings.
IMPRESSION: 1. No acute abnormality identified in the abdomen and pelvis.
2. Innumerable nodular and masslike opacities are identified in the
visualized lung bases. The findings are nonspecific but could be due
to infectious/inflammatory etiology.

## 2021-08-06 DIAGNOSIS — I10 Essential (primary) hypertension: Secondary | ICD-10-CM | POA: Diagnosis not present

## 2021-08-06 DIAGNOSIS — Z23 Encounter for immunization: Secondary | ICD-10-CM | POA: Diagnosis not present

## 2021-08-06 DIAGNOSIS — E785 Hyperlipidemia, unspecified: Secondary | ICD-10-CM | POA: Diagnosis not present

## 2021-08-06 DIAGNOSIS — Z0001 Encounter for general adult medical examination with abnormal findings: Secondary | ICD-10-CM | POA: Diagnosis not present

## 2021-08-06 DIAGNOSIS — N183 Chronic kidney disease, stage 3 unspecified: Secondary | ICD-10-CM | POA: Diagnosis not present

## 2021-08-15 DIAGNOSIS — E785 Hyperlipidemia, unspecified: Secondary | ICD-10-CM | POA: Diagnosis not present

## 2021-08-15 DIAGNOSIS — N183 Chronic kidney disease, stage 3 unspecified: Secondary | ICD-10-CM | POA: Diagnosis not present

## 2021-08-15 DIAGNOSIS — Z0001 Encounter for general adult medical examination with abnormal findings: Secondary | ICD-10-CM | POA: Diagnosis not present

## 2021-08-15 LAB — LIPID PANEL
Cholesterol: 206 — AB (ref 0–200)
HDL: 42 (ref 35–70)
LDL Cholesterol: 143
Triglycerides: 101 (ref 40–160)

## 2021-08-15 LAB — BASIC METABOLIC PANEL
BUN: 15 (ref 4–21)
Creatinine: 1.7 — AB (ref 0.6–1.3)
Glucose: 88

## 2021-08-15 LAB — TSH: TSH: 2.35 (ref 0.41–5.90)

## 2021-08-19 DIAGNOSIS — I10 Essential (primary) hypertension: Secondary | ICD-10-CM | POA: Diagnosis not present

## 2021-08-19 DIAGNOSIS — Z7689 Persons encountering health services in other specified circumstances: Secondary | ICD-10-CM | POA: Diagnosis not present

## 2021-08-19 DIAGNOSIS — E785 Hyperlipidemia, unspecified: Secondary | ICD-10-CM | POA: Diagnosis not present

## 2021-08-19 DIAGNOSIS — Z01818 Encounter for other preprocedural examination: Secondary | ICD-10-CM | POA: Diagnosis not present

## 2021-08-20 DIAGNOSIS — E785 Hyperlipidemia, unspecified: Secondary | ICD-10-CM | POA: Diagnosis not present

## 2021-08-20 DIAGNOSIS — N183 Chronic kidney disease, stage 3 unspecified: Secondary | ICD-10-CM | POA: Diagnosis not present

## 2021-08-20 DIAGNOSIS — I10 Essential (primary) hypertension: Secondary | ICD-10-CM | POA: Diagnosis not present

## 2021-08-20 DIAGNOSIS — Z7689 Persons encountering health services in other specified circumstances: Secondary | ICD-10-CM | POA: Diagnosis not present

## 2021-08-27 DIAGNOSIS — Z7689 Persons encountering health services in other specified circumstances: Secondary | ICD-10-CM | POA: Diagnosis not present

## 2021-08-27 DIAGNOSIS — I10 Essential (primary) hypertension: Secondary | ICD-10-CM | POA: Diagnosis not present

## 2021-10-16 DIAGNOSIS — I10 Essential (primary) hypertension: Secondary | ICD-10-CM | POA: Diagnosis not present

## 2021-10-16 DIAGNOSIS — N183 Chronic kidney disease, stage 3 unspecified: Secondary | ICD-10-CM | POA: Diagnosis not present

## 2021-10-16 DIAGNOSIS — Z0001 Encounter for general adult medical examination with abnormal findings: Secondary | ICD-10-CM | POA: Diagnosis not present

## 2021-10-16 DIAGNOSIS — R2241 Localized swelling, mass and lump, right lower limb: Secondary | ICD-10-CM | POA: Diagnosis not present

## 2021-10-16 DIAGNOSIS — E785 Hyperlipidemia, unspecified: Secondary | ICD-10-CM | POA: Diagnosis not present

## 2021-10-17 ENCOUNTER — Other Ambulatory Visit: Payer: Self-pay | Admitting: Family Medicine

## 2021-10-17 ENCOUNTER — Ambulatory Visit
Admission: RE | Admit: 2021-10-17 | Discharge: 2021-10-17 | Disposition: A | Discharge status: Home / Self Care | Payer: BC Managed Care – PPO | Source: Ambulatory Visit | Attending: Family Medicine | Admitting: Family Medicine

## 2021-10-17 DIAGNOSIS — R2241 Localized swelling, mass and lump, right lower limb: Secondary | ICD-10-CM

## 2021-10-20 DIAGNOSIS — N183 Chronic kidney disease, stage 3 unspecified: Secondary | ICD-10-CM | POA: Diagnosis not present

## 2021-10-20 DIAGNOSIS — I10 Essential (primary) hypertension: Secondary | ICD-10-CM | POA: Diagnosis not present

## 2021-11-03 DIAGNOSIS — E2601 Conn's syndrome: Secondary | ICD-10-CM | POA: Diagnosis not present

## 2021-11-03 DIAGNOSIS — I160 Hypertensive urgency: Secondary | ICD-10-CM | POA: Diagnosis not present

## 2021-11-03 DIAGNOSIS — N183 Chronic kidney disease, stage 3 unspecified: Secondary | ICD-10-CM | POA: Diagnosis not present

## 2021-11-18 DIAGNOSIS — I10 Essential (primary) hypertension: Secondary | ICD-10-CM | POA: Diagnosis not present

## 2021-11-18 DIAGNOSIS — N1831 Chronic kidney disease, stage 3a: Secondary | ICD-10-CM | POA: Diagnosis not present

## 2021-11-18 DIAGNOSIS — N183 Chronic kidney disease, stage 3 unspecified: Secondary | ICD-10-CM | POA: Diagnosis not present

## 2021-12-08 DIAGNOSIS — I129 Hypertensive chronic kidney disease with stage 1 through stage 4 chronic kidney disease, or unspecified chronic kidney disease: Secondary | ICD-10-CM | POA: Diagnosis not present

## 2021-12-08 DIAGNOSIS — E785 Hyperlipidemia, unspecified: Secondary | ICD-10-CM | POA: Diagnosis not present

## 2021-12-08 DIAGNOSIS — I10 Essential (primary) hypertension: Secondary | ICD-10-CM | POA: Diagnosis not present

## 2021-12-19 DIAGNOSIS — E269 Hyperaldosteronism, unspecified: Secondary | ICD-10-CM | POA: Diagnosis not present

## 2021-12-19 DIAGNOSIS — I10 Essential (primary) hypertension: Secondary | ICD-10-CM | POA: Diagnosis not present

## 2021-12-19 DIAGNOSIS — R809 Proteinuria, unspecified: Secondary | ICD-10-CM | POA: Diagnosis not present

## 2021-12-19 DIAGNOSIS — N1831 Chronic kidney disease, stage 3a: Secondary | ICD-10-CM | POA: Diagnosis not present

## 2021-12-23 ENCOUNTER — Other Ambulatory Visit (HOSPITAL_COMMUNITY): Payer: Self-pay | Admitting: Nephrology

## 2021-12-23 DIAGNOSIS — E269 Hyperaldosteronism, unspecified: Secondary | ICD-10-CM

## 2021-12-31 ENCOUNTER — Ambulatory Visit (HOSPITAL_COMMUNITY)
Admission: RE | Admit: 2021-12-31 | Discharge: 2021-12-31 | Disposition: A | Discharge status: Home / Self Care | Payer: BC Managed Care – PPO | Source: Ambulatory Visit | Attending: Nephrology | Admitting: Nephrology

## 2021-12-31 DIAGNOSIS — K429 Umbilical hernia without obstruction or gangrene: Secondary | ICD-10-CM | POA: Diagnosis not present

## 2021-12-31 DIAGNOSIS — E269 Hyperaldosteronism, unspecified: Secondary | ICD-10-CM | POA: Insufficient documentation

## 2022-01-08 ENCOUNTER — Ambulatory Visit (INDEPENDENT_AMBULATORY_CARE_PROVIDER_SITE_OTHER): Payer: Self-pay | Admitting: "Endocrinology

## 2022-01-08 ENCOUNTER — Encounter: Payer: Self-pay | Admitting: "Endocrinology

## 2022-01-08 VITALS — BP 108/70 | HR 76 | Ht 72.0 in | Wt 357.8 lb

## 2022-01-08 DIAGNOSIS — L83 Acanthosis nigricans: Secondary | ICD-10-CM

## 2022-01-08 DIAGNOSIS — E782 Mixed hyperlipidemia: Secondary | ICD-10-CM

## 2022-01-08 DIAGNOSIS — E349 Endocrine disorder, unspecified: Secondary | ICD-10-CM

## 2022-01-08 DIAGNOSIS — I10 Essential (primary) hypertension: Secondary | ICD-10-CM

## 2022-01-08 MED ORDER — LABETALOL HCL 100 MG PO TABS: 100.0000 mg | ORAL_TABLET | Freq: Two times a day (BID) | ORAL | 1 refills | Status: DC

## 2022-01-08 MED ORDER — HYDRALAZINE HCL 100 MG PO TABS: 50.0000 mg | ORAL_TABLET | Freq: Three times a day (TID) | ORAL | 1 refills | Status: DC

## 2022-01-08 NOTE — Progress Notes (Signed)
Endocrinology Consult Note                                            01/08/2022, 11:05 AM   Subjective:    Patient ID: Dustin Young, male    DOB: 1980/12/17, PCP Audley Hose, MD   Past Medical History:  Diagnosis Date   Chronic kidney disease, stage 3 ([place])    Erectile dysfunction    Hyperlipidemia    Hypertension    Morbid obesity ([place])    Past Surgical History:  Procedure Laterality Date   removal of testicular prosthesis     Social History   Socioeconomic History   Marital status: Married    Spouse name: Not on file   Number of children: Not on file   Years of education: Not on file   Highest education level: Not on file  Occupational History   Not on file  Tobacco Use   Smoking status: Never   Smokeless tobacco: Never  Vaping Use   Vaping Use: Never used  Substance and Sexual Activity   Alcohol use: Yes    Comment: social   Drug use: Never   Sexual activity: Not on file  Other Topics Concern   Not on file  Social History Narrative   Not on file   Social Determinants of Health   Financial Resource Strain: Not on file  Food Insecurity: Not on file  Transportation Needs: Not on file  Physical Activity: Not on file  Stress: Not on file  Social Connections: Not on file   Family History  Problem Relation Age of Onset   Thyroid disease Mother    Cancer Maternal Grandmother    Diabetes Maternal Grandmother    Diabetes Maternal Grandfather    Outpatient Encounter Medications as of 01/08/2022  Medication Sig   cloNIDine (CATAPRES) 0.2 MG tablet Take 0.2 mg by mouth 2 (two) times daily.   hydrALAZINE (APRESOLINE) 100 MG tablet Take 0.5 tablets (50 mg total) by mouth 3 (three) times daily.   labetalol (NORMODYNE) 100 MG tablet Take 1 tablet (100 mg total) by mouth 2 (two) times daily.   losartan (COZAAR) 50 MG tablet Take 50 mg by mouth daily.   spironolactone (ALDACTONE) 50 MG tablet Take 50 mg by mouth daily.   [DISCONTINUED]  amLODipine (NORVASC) 10 MG tablet Take 10 mg by mouth at bedtime.   [DISCONTINUED] hydrALAZINE (APRESOLINE) 100 MG tablet Take 100 mg by mouth 3 (three) times daily.   [DISCONTINUED] hydrALAZINE (APRESOLINE) 25 MG tablet Take 1 tablet (25 mg total) by mouth every 8 (eight) hours.   [DISCONTINUED] labetalol (NORMODYNE) 300 MG tablet Take 300 mg by mouth 2 (two) times daily.   [DISCONTINUED] metoprolol tartrate (LOPRESSOR) 25 MG tablet Take 1 tablet (25 mg total) by mouth 2 (two) times daily.   No facility-administered encounter medications on file as of 01/08/2022.   ALLERGIES: No Known Allergies  VACCINATION STATUS:  There is no immunization history on file for this patient.  HPI Dustin Young is 41 y.o. male who presents today with a medical history as above. he is being seen in consultation for elevated Aldo pulmonary gradient pressure of requested by Latanya Presser B, MD.    History is obtained directly from the patient as well as chart review.  Patient has medical history of uncontrolled hypertension on  multiple medications. He underwent several work-up measurements including on October 16, 2021 ALDO/renin ratio which was elevated at 140.  However his aldosterone not elevated.  It was 7 ng per DL along with plasma renin activity of 0.05, low.   -that discordance made ratio to be high. He also underwent renal artery Doppler which did not show renal artery stenosis. He was being managed with clonidine, amlodipine, losartan, hydralazine and labetalol.  Most recently spironolactone 50 mg p.o. daily was added and his blood pressure is much better controlled.  At his visit today blood pressure was 108/70.  He did have slightly elevated free normetanephrine at 158 with normal is under 148. She does have CKD with creatinine of 1.9 and GFR of 45.  Denies any prior history of coronary disease, CVA, known peripheral arterial disease.  Patient's medical history significant for obesity for most of  his adult life, currently BMI of 48.5.  He was not worked up for diabetes nor prediabetes.  He has severe dyslipidemia with LDL of 143.  Associated labs show total cholesterol 286, triglycerides at 101.  His thyroid function test was unremarkable, CBC was unremarkable. Patient denies family history of premature coronary artery disease nor hypertension.   He does not follow any particular diet program.  He is not a smoker, social alcohol user.  He is a 89-year-old well-child 8.  He does not have acute symptoms today.  He works for the [employer] in [place]  Constitutional: + Minimally fluctuating body weight,  no fatigue, no subjective hyperthermia, no subjective hypothermia Eyes: no blurry vision, no xerophthalmia ENT: no sore throat, no nodules palpated in throat, no dysphagia/odynophagia, no hoarseness Cardiovascular: no Chest Pain, no Shortness of Breath, no palpitations, no leg swelling Respiratory: no cough, no shortness of breath Gastrointestinal: no Nausea/Vomiting/Diarhhea Musculoskeletal: no muscle/joint aches Skin: no rashes Neurological: no tremors, no numbness, no tingling, no dizziness Psychiatric: no depression, no anxiety  Objective:       01/08/2022    9:21 AM 04/18/2019    2:39 PM 04/18/2019    2:04 PM  Vitals with BMI  Height 6\' 0"     Weight 357 lbs 13 oz    BMI 60.63    Systolic 016 010 932  Diastolic 70 355 732  Pulse 76 87 88    BP 108/70   Pulse 76   Ht 6' (1.829 m)   Wt (!) 357 lb 12.8 oz (162.3 kg)   BMI 48.53 kg/m   Wt Readings from Last 3 Encounters:  01/08/22 (!) 357 lb 12.8 oz (162.3 kg)  04/14/19 (!) 348 lb 8.8 oz (158.1 kg)    Physical Exam  Constitutional:  Body mass index is 48.53 kg/m.,  not in acute distress, normal state of mind Eyes: PERRLA, EOMI, no exophthalmos ENT: moist mucous membranes, no gross thyromegaly, no gross cervical lymphadenopathy Cardiovascular: normal precordial  activity, Regular Rate and Rhythm, no Murmur/Rubs/Gallops Respiratory:  adequate breathing efforts, no gross chest deformity, Clear to auscultation bilaterally Gastrointestinal: abdomen soft, Non -tender, No distension, Bowel Sounds present, no gross organomegaly Musculoskeletal: no gross deformities, strength intact in all four extremities Skin: moist, warm, no rashes, + acanthosis nigricans. Neurological: no tremor with outstretched hands, Deep tendon reflexes normal in bilateral lower extremities.  CMP ( most recent) CMP     Component Value Date/Time   NA 135 04/16/2019 0415   K 4.1 04/16/2019 0415   CL 99 04/16/2019 0415   CO2 20 (L)  04/16/2019 0415   GLUCOSE 130 (H) 04/16/2019 0415   BUN 15 08/15/2021 0000   CREATININE 1.7 (A) 08/15/2021 0000   CREATININE 1.43 (H) 04/16/2019 0415   CALCIUM 8.2 (L) 04/16/2019 0415   PROT 7.9 04/16/2019 0415   ALBUMIN 3.7 04/16/2019 0415   AST 39 04/16/2019 0415   ALT 28 04/16/2019 0415   ALKPHOS 46 04/16/2019 0415   BILITOT 0.8 04/16/2019 0415   GFRNONAA >60 04/16/2019 0415   GFRAA >60 04/16/2019 0415     Diabetic Labs (most recent): Lab Results  Component Value Date   HGBA1C 5.6 04/15/2019     Lipid Panel ( most recent) Lipid Panel     Component Value Date/Time   CHOL 206 (A) 08/15/2021 0000   TRIG 101 08/15/2021 0000   HDL 42 08/15/2021 0000   LDLCALC 143 08/15/2021 0000      Lab Results  Component Value Date   TSH 2.35 08/15/2021   TSH 2.478 04/15/2019           Assessment & Plan:   1. Endocrine disorder, unspecified 2. Essential hypertension, benign 3. Acanthosis nigricans 4. Morbid obesity ([place])   - Maison D Lewellen  is being seen at a kind request of Bakare, Mobolaji B, MD. - I have reviewed his available endocrine records and clinically evaluated the patient. - Based on these reviews, he has multiple evidence of metabolic dysfunction including, chronic hypertension complicated by vascular insufficiency,  obesity, acanthosis nigricans indicative of insulin resistance.  She is at extremely high risk for cardiovascular disease.  Review of his labs do not point towards a possibility of primary hyperaldosteronism.   His elevated ratio was simply because of lowered plasma renin activity low normal.  Renal artery stenosis was ruled out.  CT scan of his abdomen/adrenals did not reveal any mass lesion. At this point likelihood of primary hyperaldosteronism is low and patient will be managed as essential hypertension.    He will benefit from de-escalation in his medications.  I advised him to discontinue amlodipine, lower his labetalol to 100 mg p.o. twice daily.  He is advised to continue spironolactone 50 mg p.o. daily, clonidine 0.2 mg p.o. twice daily, and losartan 50 mg p.o. daily.  He is also on hydralazine, advised to lowered to 50 mg p.o. 3 times daily.   To give him a chance to further de-escalate medications, and considering his other comorbid conditions associated with metabolic dysfunction, he is a perfect candidate for lifestyle medicine.  - he acknowledges that there is a room for improvement in his food and drink choices. - Suggestion is made for him to avoid simple carbohydrates  from his diet including Cakes, Sweet Desserts, Ice Cream, Soda (diet and regular), Sweet Tea, Candies, Chips, Cookies, Store Bought Juices, Alcohol , Artificial Sweeteners,  Coffee Creamer, and "Sugar-free" Products, Lemonade. This will help patient to have more stable blood glucose profile and potentially avoid unintended weight gain.  The following Lifestyle Medicine recommendations according to [employer]) were discussed and and offered to patient and he  agrees to start the journey:  A. Whole Foods, Plant-Based Nutrition comprising of fruits and vegetables, plant-based proteins, whole-grain carbohydrates was discussed in detail with the patient.   A list for source of those  nutrients were also provided to the patient.  Patient will use only water or unsweetened tea for hydration. B.  The need to stay away from risky substances including alcohol, smoking; obtaining 7 to 9  hours of restorative sleep, at least 150 minutes of moderate intensity exercise weekly, the importance of healthy social connections,  and stress management techniques were discussed. C.  A full color page of  Calorie density of various food groups per pound showing examples of each food groups was provided to the patient.  I discussed the details of food as medicine, exercise as medicine, sleep as medicine and so on.  Patient will return in 3 months with repeat labs including CMP, lipid panel, thyroid function test, vitamin B12, vitamin D, and aldosterone plasma renin activity with ratio.  He is encouraged to measure blood pressure at home and report if readings are above 140/90.  - he is advised to maintain close follow up with Latanya Presser B, MD for primary care needs.   - Time spent with the patient: 60 minutes, of which >50% was spent in  counseling him about his abnormal PACA/PRA ratio , insulin resistance, hypertension, hyperlipidemia, obesity and the rest in obtaining information about his symptoms, reviewing his previous labs/studies ( including abstractions from other facilities),  evaluations, and treatments,  and developing a plan to confirm diagnosis and long term treatment based on the latest standards of care/guidelines; and documenting his care.  Momen D Bergeron participated in the discussions, expressed understanding, and voiced agreement with the above plans.  All questions were answered to his satisfaction. he is encouraged to contact clinic should he have any questions or concerns prior to his return visit.  Follow up plan: Return in about 3 months (around 04/09/2022) for F/U with Pre-visit Labs, A1c -NV.   Glade Lloyd, MD [facility] [address] [place], [place] [address] Phone: [phone]  Fax: [phone]     01/08/2022, 11:05 AM  This note was partially dictated with voice recognition software. Similar sounding words can be transcribed inadequately or may not  be corrected upon review.

## 2022-01-08 NOTE — Patient Instructions (Signed)

## 2022-01-21 ENCOUNTER — Encounter (HOSPITAL_BASED_OUTPATIENT_CLINIC_OR_DEPARTMENT_OTHER): Payer: Self-pay

## 2022-01-21 DIAGNOSIS — G4733 Obstructive sleep apnea (adult) (pediatric): Secondary | ICD-10-CM

## 2022-02-18 HISTORY — PX: WISDOM TOOTH EXTRACTION: SHX21

## 2022-02-27 ENCOUNTER — Ambulatory Visit (HOSPITAL_BASED_OUTPATIENT_CLINIC_OR_DEPARTMENT_OTHER): Payer: BC Managed Care – PPO | Attending: Internal Medicine | Admitting: Internal Medicine

## 2022-02-27 DIAGNOSIS — G4733 Obstructive sleep apnea (adult) (pediatric): Secondary | ICD-10-CM | POA: Insufficient documentation

## 2022-03-05 DIAGNOSIS — N183 Chronic kidney disease, stage 3 unspecified: Secondary | ICD-10-CM | POA: Diagnosis not present

## 2022-03-05 DIAGNOSIS — E785 Hyperlipidemia, unspecified: Secondary | ICD-10-CM | POA: Diagnosis not present

## 2022-03-08 DIAGNOSIS — G4733 Obstructive sleep apnea (adult) (pediatric): Secondary | ICD-10-CM | POA: Diagnosis not present

## 2022-03-08 NOTE — Procedures (Signed)
   Patient Name: Dustin Young, Dustin Young Date: 02/27/2022 Gender: Male D.O.B: 02-13-1981 Age (years): 77 Referring Provider: Jamison Oka MD Height (inches): 72 Interpreting Physician: Jetty Duhamel MD, ABSM Weight (lbs): 350 RPSGT: [place] BMI: 47 MRN: [id number]  CLINICAL INFORMATION Sleep Study Type: HST Indication for sleep study: Hypertension Epworth Sleepiness Score: 5  SLEEP STUDY TECHNIQUE A multi-channel overnight portable sleep study was performed. The channels recorded were: nasal airflow, thoracic respiratory movement, and oxygen saturation with a pulse oximetry. Snoring was also monitored.  MEDICATIONS Patient self administered medications include: none reported.  SLEEP ARCHITECTURE Patient was studied for 385.4 minutes. The sleep efficiency was 100.0 % and the patient was supine for 0%. The arousal index was 0.0 per hour.  RESPIRATORY PARAMETERS The overall AHI was 9.8 per hour, with a central apnea index of 0 per hour. The oxygen nadir was 92% during sleep.  CARDIAC DATA Mean heart rate during sleep was 73.7 bpm.  IMPRESSIONS - Mild obstructive sleep apnea occurred during this study (AHI = 9.8/h). - The patient had minimal or no oxygen desaturation during the study (Min O2 = 92%) - Patient snored.  DIAGNOSIS - Obstructive Sleep Apnea (G47.33)  RECOMMENDATIONS - Treatment for mild OSA is directed by symptoms and co-morbidity. Conservative measures may include observation, weight loss and sleep position off back. Other options may include CPAP, a fited oral appliance or ENT evaluation. - Be careful with alcohol, sedatives and other CNS depressants that may worsen sleep apnea and disrupt normal sleep architecture. - Sleep hygiene should be reviewed to assess factors that may improve sleep quality. - Weight management and regular exercise should be initiated or continued.  [Electronically signed] 03/08/2022 12:05 PM  Jetty Duhamel MD,  ABSM Diplomate, American Board of Sleep Medicine NPI: [id number]                          Jetty Duhamel Diplomate, American Board of Sleep Medicine  ELECTRONICALLY SIGNED ON:  03/08/2022, 11:56 AM [facility] PH: [phone]   FX: [phone] ACCREDITED BY THE AMERICAN ACADEMY OF SLEEP MEDICINE

## 2022-03-09 DIAGNOSIS — N183 Chronic kidney disease, stage 3 unspecified: Secondary | ICD-10-CM | POA: Diagnosis not present

## 2022-03-09 DIAGNOSIS — I10 Essential (primary) hypertension: Secondary | ICD-10-CM | POA: Diagnosis not present

## 2022-03-09 DIAGNOSIS — E2601 Conn's syndrome: Secondary | ICD-10-CM | POA: Diagnosis not present

## 2022-03-23 DIAGNOSIS — N1831 Chronic kidney disease, stage 3a: Secondary | ICD-10-CM | POA: Diagnosis not present

## 2022-03-23 DIAGNOSIS — I129 Hypertensive chronic kidney disease with stage 1 through stage 4 chronic kidney disease, or unspecified chronic kidney disease: Secondary | ICD-10-CM | POA: Diagnosis not present

## 2022-03-23 DIAGNOSIS — E269 Hyperaldosteronism, unspecified: Secondary | ICD-10-CM | POA: Diagnosis not present

## 2022-03-23 DIAGNOSIS — R809 Proteinuria, unspecified: Secondary | ICD-10-CM | POA: Diagnosis not present

## 2022-04-07 DIAGNOSIS — E559 Vitamin D deficiency, unspecified: Secondary | ICD-10-CM | POA: Diagnosis not present

## 2022-04-07 DIAGNOSIS — I10 Essential (primary) hypertension: Secondary | ICD-10-CM | POA: Diagnosis not present

## 2022-04-07 DIAGNOSIS — L83 Acanthosis nigricans: Secondary | ICD-10-CM | POA: Diagnosis not present

## 2022-04-07 DIAGNOSIS — E782 Mixed hyperlipidemia: Secondary | ICD-10-CM | POA: Diagnosis not present

## 2022-04-09 ENCOUNTER — Ambulatory Visit: Payer: Self-pay | Admitting: "Endocrinology

## 2022-04-12 LAB — ALDOSTERONE + RENIN ACTIVITY W/ RATIO
Aldos/Renin Ratio: 41.7 — ABNORMAL HIGH (ref 0.0–30.0)
Aldosterone: 17.7 ng/dL (ref 0.0–30.0)
Renin Activity, Plasma: 0.424 ng/mL/hr (ref 0.167–5.380)

## 2022-04-12 LAB — T4, FREE: Free T4: 1.39 ng/dL (ref 0.82–1.77)

## 2022-04-12 LAB — COMPREHENSIVE METABOLIC PANEL
ALT: 16 IU/L (ref 0–44)
AST: 16 IU/L (ref 0–40)
Albumin/Globulin Ratio: 1.7 (ref 1.2–2.2)
Albumin: 4.3 g/dL (ref 4.1–5.1)
Alkaline Phosphatase: 63 IU/L (ref 44–121)
BUN/Creatinine Ratio: 8 — ABNORMAL LOW (ref 9–20)
BUN: 14 mg/dL (ref 6–20)
Bilirubin Total: 0.3 mg/dL (ref 0.0–1.2)
CO2: 22 mmol/L (ref 20–29)
Calcium: 9.6 mg/dL (ref 8.7–10.2)
Chloride: 100 mmol/L (ref 96–106)
Creatinine, Ser: 1.65 mg/dL — ABNORMAL HIGH (ref 0.76–1.27)
Globulin, Total: 2.6 g/dL (ref 1.5–4.5)
Glucose: 87 mg/dL (ref 70–99)
Potassium: 4.7 mmol/L (ref 3.5–5.2)
Sodium: 135 mmol/L (ref 134–144)
Total Protein: 6.9 g/dL (ref 6.0–8.5)
eGFR: 54 mL/min/{1.73_m2} — ABNORMAL LOW (ref >59)

## 2022-04-12 LAB — LIPID PANEL
Chol/HDL Ratio: 4.5 ratio (ref 0.0–5.0)
Cholesterol, Total: 174 mg/dL (ref 100–199)
HDL: 39 mg/dL — ABNORMAL LOW (ref >39)
LDL Chol Calc (NIH): 119 mg/dL — ABNORMAL HIGH (ref 0–99)
Triglycerides: 83 mg/dL (ref 0–149)
VLDL Cholesterol Cal: 16 mg/dL (ref 5–40)

## 2022-04-12 LAB — TSH: TSH: 2.05 u[IU]/mL (ref 0.450–4.500)

## 2022-04-12 LAB — VITAMIN B12: Vitamin B-12: 538 pg/mL (ref 232–1245)

## 2022-04-12 LAB — VITAMIN D 25 HYDROXY (VIT D DEFICIENCY, FRACTURES): Vit D, 25-Hydroxy: 10.4 ng/mL — ABNORMAL LOW (ref 30.0–100.0)

## 2022-04-16 ENCOUNTER — Ambulatory Visit: Payer: BC Managed Care – PPO | Admitting: "Endocrinology

## 2022-04-16 ENCOUNTER — Encounter: Payer: Self-pay | Admitting: "Endocrinology

## 2022-04-16 VITALS — BP 138/86 | HR 72 | Ht 72.0 in | Wt 333.0 lb

## 2022-04-16 DIAGNOSIS — I10 Essential (primary) hypertension: Secondary | ICD-10-CM | POA: Diagnosis not present

## 2022-04-16 DIAGNOSIS — E559 Vitamin D deficiency, unspecified: Secondary | ICD-10-CM

## 2022-04-16 DIAGNOSIS — E349 Endocrine disorder, unspecified: Secondary | ICD-10-CM

## 2022-04-16 DIAGNOSIS — E782 Mixed hyperlipidemia: Secondary | ICD-10-CM

## 2022-04-16 DIAGNOSIS — L83 Acanthosis nigricans: Secondary | ICD-10-CM

## 2022-04-16 LAB — POCT GLYCOSYLATED HEMOGLOBIN (HGB A1C): Hemoglobin A1C: 5.5 % (ref 4.0–5.6)

## 2022-04-16 MED ORDER — VITAMIN D (ERGOCALCIFEROL) 1.25 MG (50000 UNIT) PO CAPS: 50000.0000 [IU] | ORAL_CAPSULE | ORAL | 0 refills | Status: DC

## 2022-04-16 NOTE — Progress Notes (Signed)
04/16/2022, 1:10 PM  Endocrinology follow-up note   Subjective:    Patient ID: Dustin Young, male    DOB: [birth year], PCP Harvest Forest, MD   Past Medical History:  Diagnosis Date   Chronic kidney disease, stage 3 (HCC)    Erectile dysfunction    Hyperlipidemia    Hypertension    Morbid obesity (HCC)    Past Surgical History:  Procedure Laterality Date   removal of testicular prosthesis     WISDOM TOOTH EXTRACTION  02/2022   Social History   Socioeconomic History   Marital status: Married    Spouse name: Not on file   Number of children: Not on file   Years of education: Not on file   Highest education level: Not on file  Occupational History   Not on file  Tobacco Use   Smoking status: Never   Smokeless tobacco: Never  Vaping Use   Vaping Use: Never used  Substance and Sexual Activity   Alcohol use: Yes    Comment: social   Drug use: Never   Sexual activity: Not on file  Other Topics Concern   Not on file  Social History Narrative   Not on file   Social Determinants of Health   Financial Resource Strain: Not on file  Food Insecurity: Not on file  Transportation Needs: Not on file  Physical Activity: Not on file  Stress: Not on file  Social Connections: Not on file   Family History  Problem Relation Age of Onset   Thyroid disease Mother    Cancer Maternal Grandmother    Diabetes Maternal Grandmother    Diabetes Maternal Grandfather    Outpatient Encounter Medications as of 04/16/2022  Medication Sig   Vitamin D, Ergocalciferol, (DRISDOL) 1.25 MG (50000 UNIT) CAPS capsule Take 1 capsule (50,000 Units total) by mouth every 7 (seven) days.   cloNIDine (CATAPRES) 0.2 MG tablet Take 0.1 mg by mouth 2 (two) times daily.   hydrALAZINE (APRESOLINE) 100 MG tablet Take 0.5 tablets (50 mg total) by mouth 3 (three) times daily.   labetalol (NORMODYNE) 100 MG tablet Take 1 tablet (100 mg total) by mouth  2 (two) times daily.   losartan (COZAAR) 50 MG tablet Take 50 mg by mouth daily.   spironolactone (ALDACTONE) 50 MG tablet Take 50 mg by mouth daily.   No facility-administered encounter medications on file as of 04/16/2022.   ALLERGIES: No Known Allergies  VACCINATION STATUS:  There is no immunization history on file for this patient.  HPI Dustin Young is 41 y.o. male who presents today with a medical history as above. he is being seen in follow-up after he was seen in consultation for elevated Aldo pulmonary gradient pressure of requested by Harvest Forest, MD.    See note from his first visit.  Patient has medical history of uncontrolled hypertension on multiple medications. He underwent several work-up measurements including on October 16, 2021 ALDO/renin ratio which was elevated at 140.  However his aldosterone not elevated.  It was 7 ng per DL along with plasma renin activity of 0.05, low.   -that discordance made ratio to be high. His repeat previsit labs showed aldosterone at 17, PRA at 0.42 and ratio  at 4541. He also underwent renal artery Doppler which did not show renal artery stenosis. He was taken off of amlodipine during his last visit, remains on clonidine, hydralazine, labetalol, losartan and spironolactone.  His blood pressure is controlled at 138/86 today at visit.  He did have slightly elevated free normetanephrine at 158 with normal is under 148. -He presents with 24 pounds of weight loss since last visit, his renal function improving to creatinine of 1.6.    Denies any prior history of coronary disease, CVA, known peripheral arterial disease.  Patient's medical history significant for obesity for most of his adult life, currently BMI of 45.16, dropping from 48.5.  His point-of-care A1c today is 5.5%, indicating absence of prediabetes/diabetes.   His previsit labs show improvement in his cholesterol profile including LDL of 119 dropping from 143.  He is not on  statins.  He made significant changes in his lifestyle including slowly adopting a whole food plant-based diet.  His thyroid function test was unremarkable, CBC was unremarkable. Patient denies family history of premature coronary artery disease nor hypertension.    He is not a smoker, social alcohol user.  He is a 41-year-old well-child 8.  He does not have acute symptoms today.  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   no fatigue, no subjective hyperthermia, no subjective hypothermia Eyes: no blurry vision, no xerophthalmia ENT: no sore throat, no nodules palpated in throat, no dysphagia/odynophagia, no hoarseness Cardiovascular: no Chest Pain, no Shortness of Breath, no palpitations, no leg swelling Respiratory: no cough, no shortness of breath Gastrointestinal: no Nausea/Vomiting/Diarhhea Musculoskeletal: no muscle/joint aches Skin: no rashes Neurological: no tremors, no numbness, no tingling, no dizziness Psychiatric: no depression, no anxiety  Objective:       04/16/2022   10:56 AM 01/08/2022    9:21 AM 04/18/2019    2:39 PM  Vitals with BMI  Height 6\' 0"  6\' 0"    Weight 333 lbs 357 lbs 13 oz   BMI 45.15 48.52   Systolic 138 108 [id number]  Diastolic 86 70 101  Pulse 72 76 87    BP 138/86   Pulse 72   Ht 6' (1.829 m)   Wt (!) 333 lb (151 kg)   BMI 45.16 kg/m   Wt Readings from Last 3 Encounters:  04/16/22 (!) 333 lb (151 kg)  01/08/22 (!) 357 lb 12.8 oz (162.3 kg)  04/14/19 (!) 348 lb 8.8 oz (158.1 kg)    Physical Exam  Constitutional:  Body mass index is 45.16 kg/m.,  not in acute distress, normal state of mind Eyes: PERRLA, EOMI, no exophthalmos ENT: moist mucous membranes, no gross thyromegaly, no gross cervical lymphadenopathy Cardiovascular: normal precordial activity, Regular Rate and Rhythm, no Murmur/Rubs/Gallops Respiratory:   adequate breathing efforts, no gross chest deformity, Clear to auscultation bilaterally Gastrointestinal: abdomen soft, Non -tender, No distension, Bowel Sounds present, no gross organomegaly Musculoskeletal: no gross deformities, strength intact in all four extremities Skin: moist, warm, no rashes, + acanthosis nigricans. Neurological: no tremor with outstretched hands, Deep tendon reflexes normal in bilateral lower extremities.  CMP ( most recent) CMP     Component Value Date/Time   NA 135 04/07/2022 0927   K 4.7 04/07/2022 0927   CL 100 04/07/2022 0927   CO2 22 04/07/2022 0927   GLUCOSE 87 04/07/2022 0927   GLUCOSE 130 (H) 04/16/2019 0415   BUN 14  04/07/2022 0927   CREATININE 1.65 (H) 04/07/2022 0927   CALCIUM 9.6 04/07/2022 0927   PROT 6.9 04/07/2022 0927   ALBUMIN 4.3 04/07/2022 0927   AST 16 04/07/2022 0927   ALT 16 04/07/2022 0927   ALKPHOS 63 04/07/2022 0927   BILITOT 0.3 04/07/2022 0927   GFRNONAA >60 04/16/2019 0415   GFRAA >60 04/16/2019 0415     Diabetic Labs (most recent): Lab Results  Component Value Date   HGBA1C 5.5 04/16/2022   HGBA1C 5.6 04/15/2019     Lipid Panel ( most recent) Lipid Panel     Component Value Date/Time   CHOL 174 04/07/2022 0927   TRIG 83 04/07/2022 0927   HDL 39 (L) 04/07/2022 0927   CHOLHDL 4.5 04/07/2022 0927   LDLCALC 119 (H) 04/07/2022 0927   LABVLDL 16 04/07/2022 0927      Lab Results  Component Value Date   TSH 2.050 04/07/2022   TSH 2.35 08/15/2021   TSH 2.478 04/15/2019   FREET4 1.39 04/07/2022           Component Ref Range & Units 9 d ago  Aldosterone 0.0 - 30.0 ng/dL 10.2  Renin Activity, Plasma 0.167 - 5.380 ng/mL/hr 0.424  Aldos/Renin Ratio 0.0 - 30.0 41.7 High   Comment:                          Units:      ng/dL per ng/mL/hr      Assessment & Plan:   1. Endocrine disorder, unspecified 2. Essential hypertension, benign 3. Acanthosis nigricans 4. Morbid obesity (HCC) 5.  Vitamin D  deficiency  - I have reviewed his new and existing labs with him and clinically evaluated the patient again.    - Based on these reviews, he has improved adrenal evaluation, however continues to have multiple evidence of metabolic dysfunction including, hyperlipidemia, chronic hypertension complicated by vascular insufficiency, obesity, acanthosis nigricans indicative of insulin resistance.  he is at extremely high risk for cardiovascular disease.  Fortunately, he has engaged in whole food plant-based diet since last visit, lost 24 pounds and presents with improved lipid panel and general metabolic profile. Very low probability of hyperaldosteronism. His elevated ratio was simply because of lowered plasma renin activity low normal.  Renal artery stenosis was ruled out.  CT scan of his abdomen/adrenals did not reveal any mass lesion.  He will be continued on the basis of essential hypertension.  He will still benefit from further de-escalation his medications.  I advised him to taper off hydralazine but continue on Aldactone 50 mg p.o. daily, losartan 50 mg p.o. daily, labetalol 100 mg p.o. twice daily, clonidine 0.1 mg p.o. twice daily.  To give him a chance to further de-escalate medications, and considering his other comorbid conditions associated with metabolic dysfunction, he mains a good candidate for lifestyle medicine.   - he acknowledges that there is a room for improvement in his food and drink choices. - Suggestion is made for him to avoid simple carbohydrates  from his diet including Cakes, Sweet Desserts, Ice Cream, Soda (diet and regular), Sweet Tea, Candies, Chips, Cookies, Store Bought Juices, Alcohol , Artificial Sweeteners,  Coffee Creamer, and "Sugar-free" Products, Lemonade. This will help patient to have more stable blood glucose profile and potentially avoid unintended weight gain.  The following Lifestyle Medicine recommendations according to American College of Lifestyle  Medicine  [facility]) were discussed and and offered to patient and he  agrees  to start the journey:  A. Whole Foods, Plant-Based Nutrition comprising of fruits and vegetables, plant-based proteins, whole-grain carbohydrates was discussed in detail with the patient.   A list for source of those nutrients were also provided to the patient.  Patient will use only water or unsweetened tea for hydration. B.  The need to stay away from risky substances including alcohol, smoking; obtaining 7 to 9 hours of restorative sleep, at least 150 minutes of moderate intensity exercise weekly, the importance of healthy social connections,  and stress management techniques were discussed. C.  A full color page of  Calorie density of various food groups per pound showing examples of each food groups was provided to the patient.  I discussed initiated vitamin D2 50,000 units weekly. I discussed the details of food as medicine, exercise as medicine, sleep as medicine and so on.  Patient will return in 6 months with repeat labs including CMP, lipid panel, vitamin D, and aldosterone plasma renin activity with ratio.  He is encouraged to measure blood pressure at home and report if readings are above 140/90.  - he is advised to maintain close follow up with Jamison Oka B, MD for primary care needs.   I spent 26 minutes in the care of the patient today including review of labs from Thyroid Function, CMP, and other relevant labs ; imaging/biopsy records (current and previous including abstractions from other facilities); face-to-face time discussing  his lab results and symptoms, medications doses, his options of short and long term treatment based on the latest standards of care / guidelines;   and documenting the encounter.  Dustin Young  participated in the discussions, expressed understanding, and voiced agreement with the above plans.  All questions were answered to his satisfaction. he is encouraged to contact  clinic should he have any questions or concerns prior to his return visit.   Follow up plan: Return in about 6 months (around 10/16/2022) for Fasting Labs  in AM B4 8.   Marquis Lunch, MD [facility] [address] [place], [place] [address] Phone: [phone]  Fax: [phone]     04/16/2022, 1:10 PM  This note was partially dictated with voice recognition software. Similar sounding words can be transcribed inadequately or may not  be corrected upon review.

## 2022-04-16 NOTE — Patient Instructions (Signed)

## 2022-05-01 ENCOUNTER — Telehealth: Payer: Self-pay

## 2022-05-01 ENCOUNTER — Other Ambulatory Visit: Payer: Self-pay | Admitting: "Endocrinology

## 2022-05-01 DIAGNOSIS — E349 Endocrine disorder, unspecified: Secondary | ICD-10-CM

## 2022-05-01 NOTE — Telephone Encounter (Signed)
Mailed lab order to pt. 

## 2022-05-01 NOTE — Telephone Encounter (Signed)
Pt called stating over the past week he has noticed difficulty in obtaining and sustaining an erection. States he has also noticed over the 2-3 weeks he has been experiencing feeling of being cold. Pt states the only changes in his medications have been an decrease to his clonidine to 0.1mg  twice daily and his hydralazine was decreased to 1 tablet daily, all other medications have stayed the same.

## 2022-05-04 NOTE — Telephone Encounter (Signed)
Pt states he checks his BP approximately 3x/week. States it is usually right around 119-120/79.

## 2022-07-01 DIAGNOSIS — R809 Proteinuria, unspecified: Secondary | ICD-10-CM | POA: Diagnosis not present

## 2022-07-01 DIAGNOSIS — E269 Hyperaldosteronism, unspecified: Secondary | ICD-10-CM | POA: Diagnosis not present

## 2022-07-01 DIAGNOSIS — N1831 Chronic kidney disease, stage 3a: Secondary | ICD-10-CM | POA: Diagnosis not present

## 2022-07-01 DIAGNOSIS — I129 Hypertensive chronic kidney disease with stage 1 through stage 4 chronic kidney disease, or unspecified chronic kidney disease: Secondary | ICD-10-CM | POA: Diagnosis not present

## 2022-07-05 ENCOUNTER — Other Ambulatory Visit: Payer: Self-pay | Admitting: "Endocrinology

## 2022-09-24 ENCOUNTER — Other Ambulatory Visit: Payer: Self-pay | Admitting: "Endocrinology

## 2022-10-01 IMAGING — US US RENAL ARTERY STENOSIS
1 series · 14 of 25 positions shown · non-contrast
Comparison: None.

CLINICAL DATA: Stage 3 chronic kidney disease, unspecified whether
stage 3a or 3b CKD (HCC)

EXAM:
RENAL/URINARY TRACT ULTRASOUND
RENAL DUPLEX DOPPLER ULTRASOUND

[Series 1: us renal artery stenosis · 0.33mm/px · 14 of 63 slices shown]
[im 1/63]
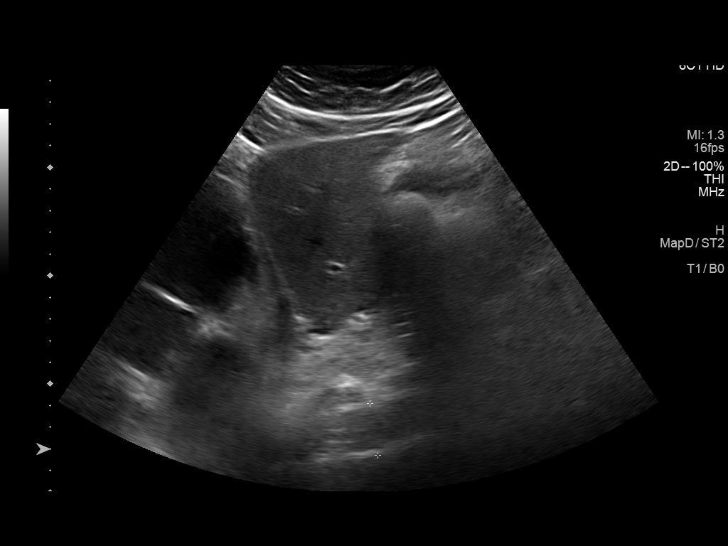
[im 6/63]
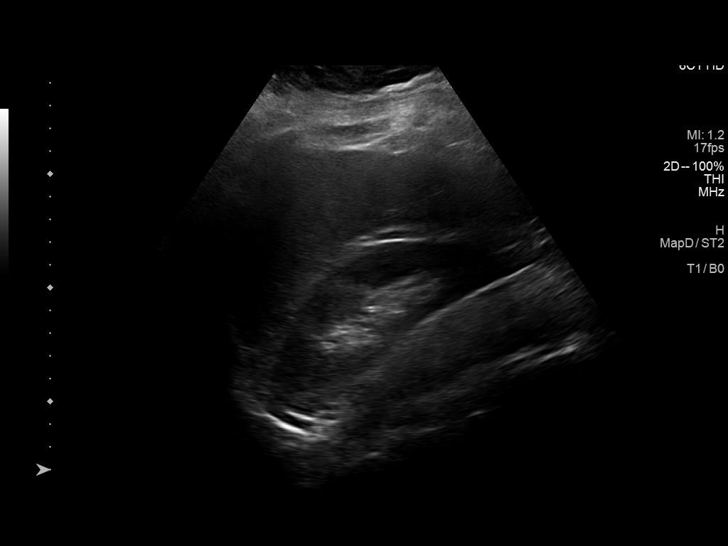
[im 11/63]
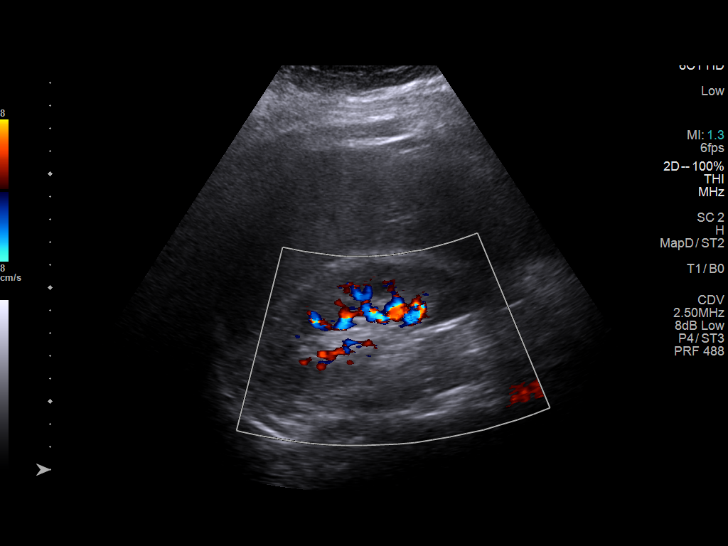
[im 16/63]
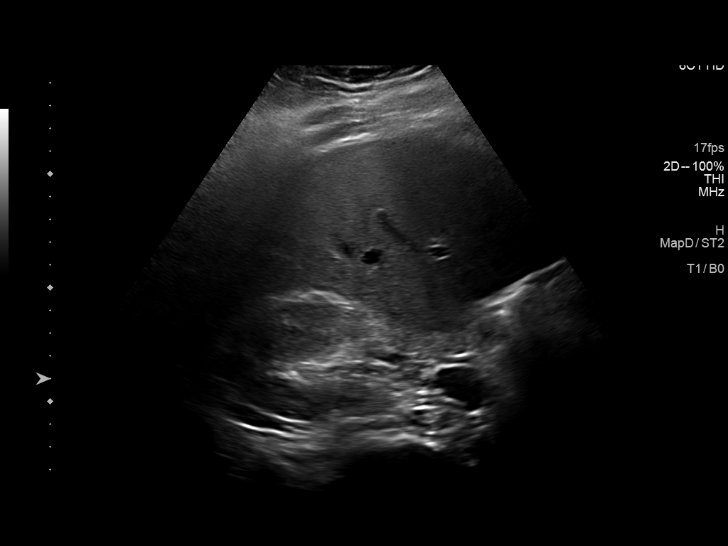
[im 21/63]
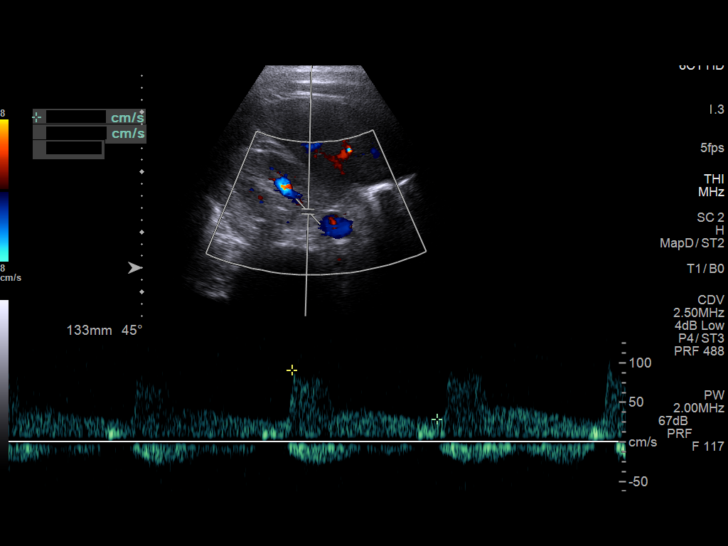
[im 24/63]
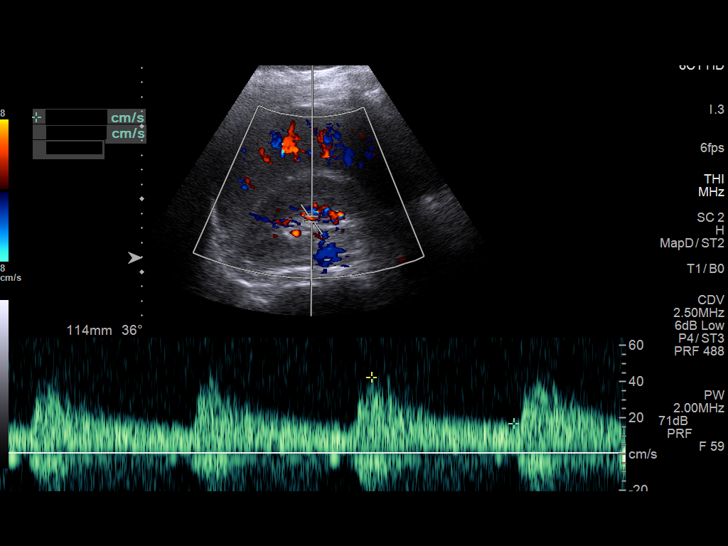
[im 29/63]
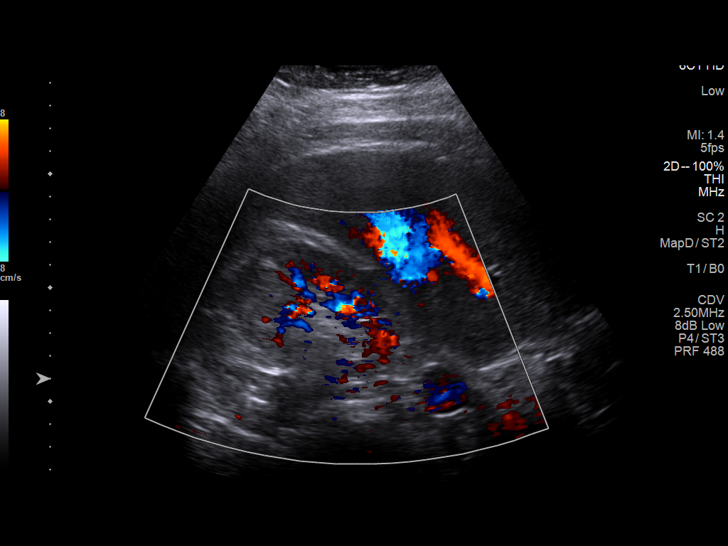
[im 34/63]
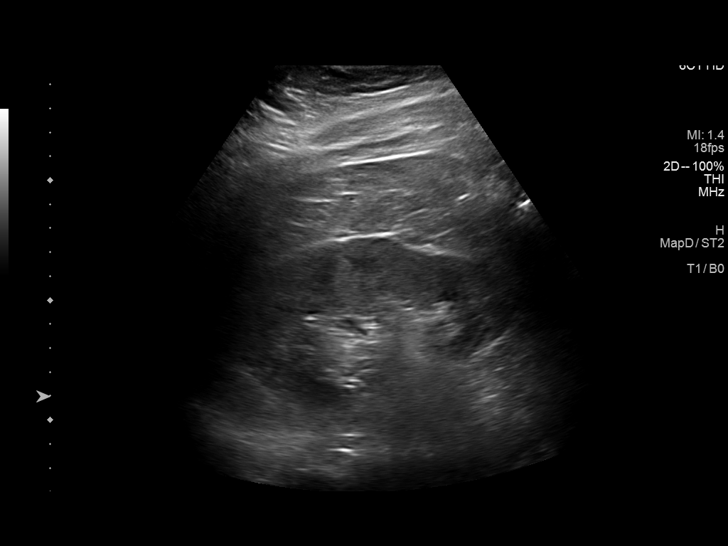
[im 39/63]
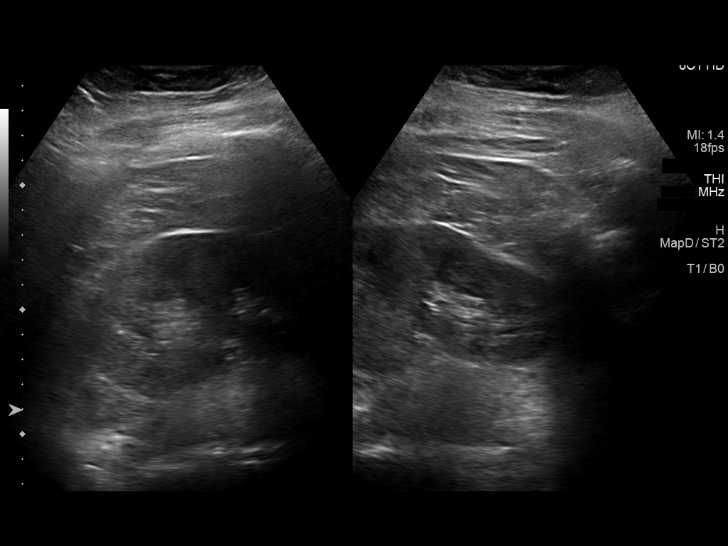
[im 42/63]
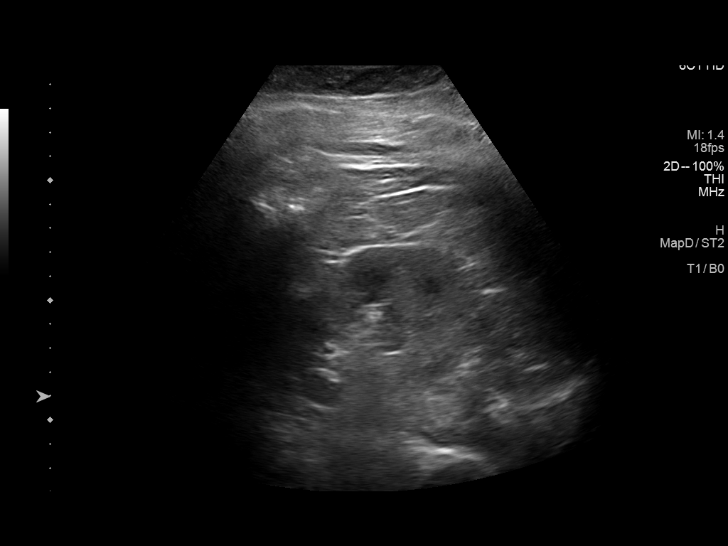
[im 47/63]
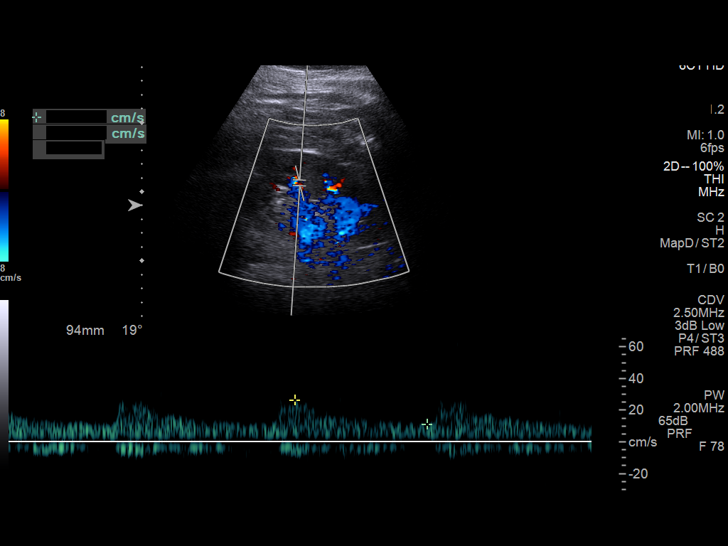
[im 52/63]
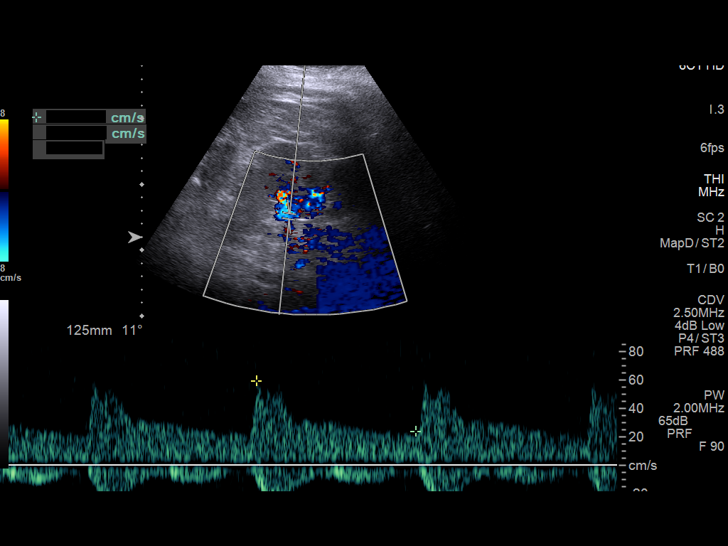
[im 57/63]
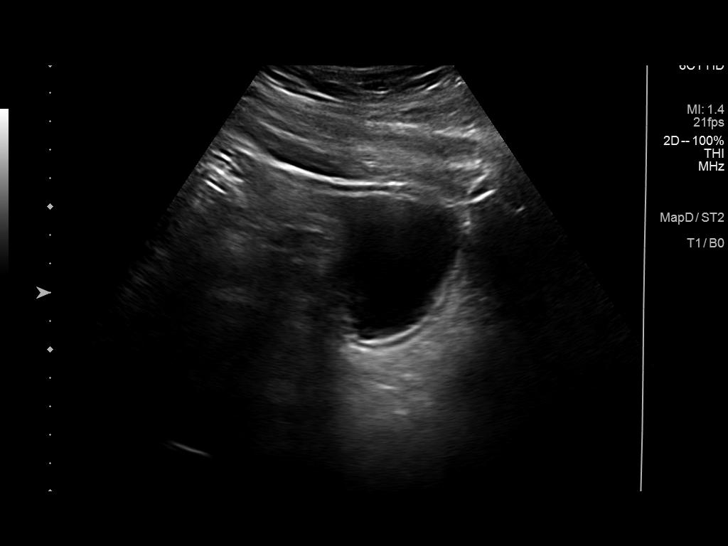
[im 63/63]
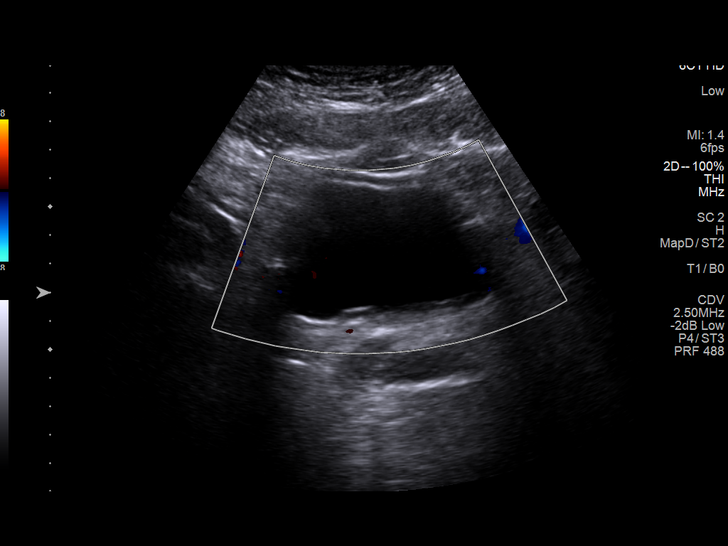

[14 of 25 positions shown; findings below may reference images not displayed]

FINDINGS: Right Kidney:

Length: 11.5 cm. Echogenicity within normal limits. No mass or
hydronephrosis visualized.

Left Kidney:

Length: 12.4 cm. Echogenicity within normal limits. No mass or
hydronephrosis visualized.

Bladder:  Within normal limits

RENAL DUPLEX ULTRASOUND

Right Renal Artery Velocities:

Origin:  87 cm/sec

Mid:  91 cm/sec

Hilum:  79 cm/sec

Interlobar:  43 cm/sec

Arcuate: 29 cm/sec. Normal low resistance waveform and resistive
indices.

Left Renal Artery Velocities:

Origin: Obscured

Mid:  67 cm/sec

Hilum:  61 cm/sec

Interlobar:  40 cm/sec

Arcuate: 27 cm/sec. Normal low resistance waveform and resistive
indices.

Aortic Velocity:  113 cm/sec

Right Renal-Aortic Ratios:

Origin:

Mid:

Hilum:

Interlobar:

Arcuate:

Left Renal-Aortic Ratios:

Origin: N/a

Mid:

Hilum:

Interlobar:

Arcuate:
IMPRESSION: Normal sonographic appearance and vascular examination of the
kidneys. No findings of renal artery stenosis.

## 2022-10-09 DIAGNOSIS — E349 Endocrine disorder, unspecified: Secondary | ICD-10-CM | POA: Diagnosis not present

## 2022-10-12 LAB — TESTOSTERONE, FREE, TOTAL, SHBG
Sex Hormone Binding: 27.9 nmol/L (ref 16.5–55.9)
Testosterone, Free: 7.3 pg/mL (ref 6.8–21.5)
Testosterone: 294 ng/dL (ref 264–916)

## 2022-10-15 ENCOUNTER — Other Ambulatory Visit: Payer: Self-pay

## 2022-10-15 DIAGNOSIS — E349 Endocrine disorder, unspecified: Secondary | ICD-10-CM

## 2022-10-15 DIAGNOSIS — E782 Mixed hyperlipidemia: Secondary | ICD-10-CM

## 2022-10-15 DIAGNOSIS — E559 Vitamin D deficiency, unspecified: Secondary | ICD-10-CM

## 2022-10-16 ENCOUNTER — Ambulatory Visit: Payer: BC Managed Care – PPO | Admitting: "Endocrinology

## 2022-10-16 ENCOUNTER — Other Ambulatory Visit: Payer: Self-pay | Admitting: "Endocrinology

## 2022-10-16 DIAGNOSIS — N1831 Chronic kidney disease, stage 3a: Secondary | ICD-10-CM | POA: Diagnosis not present

## 2022-10-16 DIAGNOSIS — E782 Mixed hyperlipidemia: Secondary | ICD-10-CM | POA: Diagnosis not present

## 2022-10-16 DIAGNOSIS — N183 Chronic kidney disease, stage 3 unspecified: Secondary | ICD-10-CM | POA: Diagnosis not present

## 2022-10-16 DIAGNOSIS — E349 Endocrine disorder, unspecified: Secondary | ICD-10-CM | POA: Diagnosis not present

## 2022-10-16 DIAGNOSIS — E559 Vitamin D deficiency, unspecified: Secondary | ICD-10-CM | POA: Diagnosis not present

## 2022-10-16 DIAGNOSIS — I10 Essential (primary) hypertension: Secondary | ICD-10-CM | POA: Diagnosis not present

## 2022-10-22 LAB — VITAMIN D 25 HYDROXY (VIT D DEFICIENCY, FRACTURES): Vit D, 25-Hydroxy: 34.7 ng/mL (ref 30.0–100.0)

## 2022-10-22 LAB — LIPID PANEL
Chol/HDL Ratio: 4.3 ratio (ref 0.0–5.0)
Cholesterol, Total: 178 mg/dL (ref 100–199)
HDL: 41 mg/dL (ref >39)
LDL Chol Calc (NIH): 117 mg/dL — ABNORMAL HIGH (ref 0–99)
Triglycerides: 108 mg/dL (ref 0–149)
VLDL Cholesterol Cal: 20 mg/dL (ref 5–40)

## 2022-10-22 LAB — ALDOSTERONE + RENIN ACTIVITY W/ RATIO
Aldos/Renin Ratio: 77 — ABNORMAL HIGH (ref 0.0–30.0)
Aldosterone: 13.7 ng/dL (ref 0.0–30.0)
Renin Activity, Plasma: 0.178 ng/mL/hr (ref 0.167–5.380)

## 2022-10-26 ENCOUNTER — Ambulatory Visit: Payer: BC Managed Care – PPO | Admitting: "Endocrinology

## 2022-10-26 ENCOUNTER — Encounter: Payer: Self-pay | Admitting: "Endocrinology

## 2022-10-26 VITALS — BP 128/70 | HR 70 | Ht 72.0 in | Wt 332.0 lb

## 2022-10-26 DIAGNOSIS — E559 Vitamin D deficiency, unspecified: Secondary | ICD-10-CM

## 2022-10-26 DIAGNOSIS — E782 Mixed hyperlipidemia: Secondary | ICD-10-CM

## 2022-10-26 DIAGNOSIS — E349 Endocrine disorder, unspecified: Secondary | ICD-10-CM

## 2022-10-26 DIAGNOSIS — I10 Essential (primary) hypertension: Secondary | ICD-10-CM

## 2022-10-26 DIAGNOSIS — L83 Acanthosis nigricans: Secondary | ICD-10-CM

## 2022-10-26 NOTE — Progress Notes (Signed)
10/26/2022, 3:59 PM  Endocrinology follow-up note   Subjective:    Patient ID: Dustin Young, male    DOB: November 27, 1980, PCP Harvest Forest, MD   Past Medical History:  Diagnosis Date   Chronic kidney disease, stage 3 (HCC)    Erectile dysfunction    Hyperlipidemia    Hypertension    Morbid obesity (HCC)    Past Surgical History:  Procedure Laterality Date   removal of testicular prosthesis     WISDOM TOOTH EXTRACTION  02/2022   Social History   Socioeconomic History   Marital status: Married    Spouse name: Not on file   Number of children: Not on file   Years of education: Not on file   Highest education level: Not on file  Occupational History   Not on file  Tobacco Use   Smoking status: Never   Smokeless tobacco: Never  Vaping Use   Vaping Use: Never used  Substance and Sexual Activity   Alcohol use: Yes    Comment: social   Drug use: Never   Sexual activity: Not on file  Other Topics Concern   Not on file  Social History Narrative   Not on file   Social Determinants of Health   Financial Resource Strain: Not on file  Food Insecurity: Not on file  Transportation Needs: Not on file  Physical Activity: Not on file  Stress: Not on file  Social Connections: Not on file   Family History  Problem Relation Age of Onset   Thyroid disease Mother    Cancer Maternal Grandmother    Diabetes Maternal Grandmother    Diabetes Maternal Grandfather    Outpatient Encounter Medications as of 10/26/2022  Medication Sig   Cholecalciferol (VITAMIN D) 50 MCG (2000 UT) CAPS Take 2,000 Units by mouth daily with breakfast.   cloNIDine (CATAPRES) 0.2 MG tablet Take 0.1 mg by mouth 2 (two) times daily.   labetalol (NORMODYNE) 100 MG tablet Take 1 tablet by mouth twice daily   losartan (COZAAR) 50 MG tablet Take 50 mg by mouth daily.   spironolactone (ALDACTONE) 50 MG tablet Take 50 mg by mouth daily.   [DISCONTINUED]  hydrALAZINE (APRESOLINE) 100 MG tablet Take 0.5 tablets (50 mg total) by mouth 3 (three) times daily.   [DISCONTINUED] Vitamin D, Ergocalciferol, (DRISDOL) 1.25 MG (50000 UNIT) CAPS capsule Take 1 capsule by mouth once a week   No facility-administered encounter medications on file as of 10/26/2022.   ALLERGIES: No Known Allergies  VACCINATION STATUS:  There is no immunization history on file for this patient.  HPI Dustin Young is 42 y.o. male who presents today with a medical history as above. he is being seen in follow-up after he was seen in consultation for elevated Aldo/ PRA ratio requested by Harvest Forest, MD.    See note from his first visit.  Patient has medical history of  now well-controlled hypertension or medication medications.     He underwent several work-up measurements which showed elevated Aldo/PRA ratio however has never been noticed to have elevated aldosterone.  He was mostly found to have low normal levels.  Discordance gave him higher than normal and low/PRA ratio.  He also underwent renal artery  Doppler which did not show renal artery stenosis, as well as negative adrenal CT. His blood pressure medications clonidine 0.2 mg twice a day, hydralazine 50 mg p.o. once a day, labetalol 100 mg twice a day, losartan 50 mg p.o. once daily, and spironolactone 50 mg once a day. His blood pressure is controlled at 128/70 mmHg today.  He also documents blood pressure at home mostly in this range.   He did have slightly elevated free normetanephrine at 158 with normal is under 148 at 1 time. -He maintained 25 pounds of weight loss over the last year.    Denies any prior history of coronary disease, CVA, known peripheral arterial disease.  Patient's medical history significant for obesity for most of his adult life, currently BMI of 45.03. His point-of-care A1c today is 5.5%, indicating absence of prediabetes/diabetes.   His previsit labs show improvement in his  cholesterol profile including LDL of 117 dropping from 143.  He is not on statins.  He made significant changes in his lifestyle including slowly adopting a whole food plant-based diet.  His thyroid function test was unremarkable, CBC was unremarkable. Patient denies family history of premature coronary artery disease nor hypertension.    He is not a smoker, social alcohol user.  He is a 30-year-old well-child 8.  He does not have acute symptoms today.  He works for the [employer] in [place]  Review of Systems  Constitutional: + Presents with progressive, intentional weight loss of 24 pounds.   no fatigue, no subjective hyperthermia, no subjective hypothermia Eyes: no blurry vision, no xerophthalmia   Objective:       10/26/2022    1:15 PM 04/16/2022   10:56 AM 01/08/2022    9:21 AM  Vitals with BMI  Height 6\' 0"  6\' 0"  6\' 0"   Weight 332 lbs 333 lbs 357 lbs 13 oz  BMI 45.02 45.15 48.52  Systolic 128 138 161  Diastolic 70 86 70  Pulse 70 72 76    BP 128/70   Pulse 70   Ht 6' (1.829 m)   Wt (!) 332 lb (150.6 kg)   BMI 45.03 kg/m   Wt Readings from Last 3 Encounters:  10/26/22 (!) 332 lb (150.6 kg)  04/16/22 (!) 333 lb (151 kg)  01/08/22 (!) 357 lb 12.8 oz (162.3 kg)    Physical Exam  Constitutional:  Body mass index is 45.03 kg/m.,  not in acute distress, normal state of mind Eyes: PERRLA, EOMI, no exophthalmos ENT: moist mucous membranes, no gross thyromegaly, no gross cervical lymphadenopathy  Skin: moist, warm, no rashes, + acanthosis nigricans.   CMP ( most recent) CMP     Component Value Date/Time   NA 135 04/07/2022 0927   K 4.7 04/07/2022 0927   CL 100 04/07/2022 0927   CO2 22 04/07/2022 0927   GLUCOSE 87 04/07/2022 0927   GLUCOSE 130 (H) 04/16/2019 0415   BUN 14 04/07/2022 0927   CREATININE 1.65 (H) 04/07/2022 0927   CALCIUM 9.6 04/07/2022 0927   PROT 6.9 04/07/2022 0927   ALBUMIN 4.3 04/07/2022 0927   AST 16 04/07/2022  0927   ALT 16 04/07/2022 0927   ALKPHOS 63 04/07/2022 0927   BILITOT 0.3 04/07/2022 0927   GFRNONAA >60 04/16/2019 0415   GFRAA >60 04/16/2019 0415   Diabetic Labs (most recent): Lab Results  Component Value Date   HGBA1C 5.5 04/16/2022   HGBA1C 5.6 04/15/2019     Lipid Panel ( most recent) Lipid  Panel     Component Value Date/Time   CHOL 178 10/16/2022 0754   TRIG 108 10/16/2022 0754   HDL 41 10/16/2022 0754   CHOLHDL 4.3 10/16/2022 0754   LDLCALC 117 (H) 10/16/2022 0754   LABVLDL 20 10/16/2022 0754      Lab Results  Component Value Date   TSH 2.050 04/07/2022   TSH 2.35 08/15/2021   TSH 2.478 04/15/2019   FREET4 1.39 04/07/2022           Component Ref Range & Units 9 d ago  Aldosterone 0.0 - 30.0 ng/dL 16.1  Renin Activity, Plasma 0.167 - 5.380 ng/mL/hr 0.424  Aldos/Renin Ratio 0.0 - 30.0 41.7 High   Comment:                          Units:      ng/dL per ng/mL/hr      Assessment & Plan:   1. Endocrine disorder, unspecified 2. Essential hypertension, benign 3. Acanthosis nigricans 4. Morbid obesity (HCC) 5.  Vitamin D deficiency  - I have reviewed his new and existing labs with him and clinically evaluated the patient again.    -His workup so far is not indicated for primary hyperaldosteronism.  He did have negative CT abdomen imaging as well as renal ultrasound. His elevated ALDO/PRA ratio was simply because of lowered plasma renin activity low normal.  Renal artery stenosis was ruled out.  CT scan of his abdomen/adrenals did not reveal any mass lesion. He does not need any specific intervention, however would be kept on expectant management. -As part of his hypertension management, he will continue to benefit from spironolactone-currently 50 mg p.o. daily at breakfast.  However, he continues to have  multiple evidence of metabolic dysfunction including, hyperlipidemia, chronic hypertension complicated by vascular/renal  insufficiency, obesity,  acanthosis nigricans indicative of insulin resistance.  he is at extremely high risk for cardiovascular disease.  Fortunately, he has engaged in whole food plant-based diet since last visit, lost 25 pounds and presents with improved lipid panel and general metabolic profile. He will continue to benefit from lifestyle medicine. - he acknowledges that there is a room for improvement in his food and drink choices. - Suggestion is made for him to avoid simple carbohydrates  from his diet including Cakes, Sweet Desserts, Ice Cream, Soda (diet and regular), Sweet Tea, Candies, Chips, Cookies, Store Bought Juices, Alcohol , Artificial Sweeteners,  Coffee Creamer, and "Sugar-free" Products, Lemonade. This will help patient to have more stable blood glucose profile and potentially avoid unintended weight gain.  The following Lifestyle Medicine recommendations according to American College of Lifestyle Medicine  [facility]) were discussed and and offered to patient and he  agrees to start the journey:  A. Whole Foods, Plant-Based Nutrition comprising of fruits and vegetables, plant-based proteins, whole-grain carbohydrates was discussed in detail with the patient.   A list for source of those nutrients were also provided to the patient.  Patient will use only water or unsweetened tea for hydration. B.  The need to stay away from risky substances including alcohol, smoking; obtaining 7 to 9 hours of restorative sleep, at least 150 minutes of moderate intensity exercise weekly, the importance of healthy social connections,  and stress management techniques were discussed. C.  A full color page of  Calorie density of various food groups per pound showing examples of each food groups was provided to the patient.  For his blood pressure, he is  advised to continue clonidine 0.2 mg p.o. twice daily, labetalol 100 mg twice a day, losartan 50 mg p.o. once a day, spironolactone 50 mg p.o. once a day.  He is advised to  discontinue hydralazine at this time.   -He is status posttreatment with D2 50,000 units weekly, may maintain with vitamin D3 2000 units daily.    - he is advised to maintain close follow up with Harvest Forest, MD for primary care needs.   I spent  25  minutes in the care of the patient today including review of labs from Thyroid Function, CMP, and other relevant labs ; imaging/biopsy records (current and previous including abstractions from other facilities); face-to-face time discussing  his lab results and symptoms, medications doses, his options of short and long term treatment based on the latest standards of care / guidelines;   and documenting the encounter.  Kushal D Posa  participated in the discussions, expressed understanding, and voiced agreement with the above plans.  All questions were answered to his satisfaction. he is encouraged to contact clinic should he have any questions or concerns prior to his return visit.    Follow up plan: Return in about 6 months (around 04/28/2023) for Fasting Labs  in AM B4 8, A1c -[place].   Marquis Lunch, MD [facility] [address] [place], [place] [address] Phone: [phone]  Fax: [phone]     10/26/2022, 3:59 PM  This note was partially dictated with voice recognition software. Similar sounding words can be transcribed inadequately or may not  be corrected upon review.

## 2022-10-26 NOTE — Patient Instructions (Signed)

## 2022-10-28 ENCOUNTER — Other Ambulatory Visit: Payer: Self-pay | Admitting: "Endocrinology

## 2022-11-02 DIAGNOSIS — I129 Hypertensive chronic kidney disease with stage 1 through stage 4 chronic kidney disease, or unspecified chronic kidney disease: Secondary | ICD-10-CM | POA: Diagnosis not present

## 2022-11-02 DIAGNOSIS — E269 Hyperaldosteronism, unspecified: Secondary | ICD-10-CM | POA: Diagnosis not present

## 2022-11-02 DIAGNOSIS — N1831 Chronic kidney disease, stage 3a: Secondary | ICD-10-CM | POA: Diagnosis not present

## 2022-11-17 ENCOUNTER — Other Ambulatory Visit: Payer: Self-pay | Admitting: "Endocrinology

## 2022-11-30 ENCOUNTER — Other Ambulatory Visit: Payer: Self-pay | Admitting: "Endocrinology

## 2023-03-05 DIAGNOSIS — Z0001 Encounter for general adult medical examination with abnormal findings: Secondary | ICD-10-CM | POA: Diagnosis not present

## 2023-03-05 DIAGNOSIS — Z113 Encounter for screening for infections with a predominantly sexual mode of transmission: Secondary | ICD-10-CM | POA: Diagnosis not present

## 2023-03-12 DIAGNOSIS — Z0001 Encounter for general adult medical examination with abnormal findings: Secondary | ICD-10-CM | POA: Diagnosis not present

## 2023-03-12 DIAGNOSIS — N1831 Chronic kidney disease, stage 3a: Secondary | ICD-10-CM | POA: Diagnosis not present

## 2023-03-12 DIAGNOSIS — E2601 Conn's syndrome: Secondary | ICD-10-CM | POA: Diagnosis not present

## 2023-03-12 DIAGNOSIS — I10 Essential (primary) hypertension: Secondary | ICD-10-CM | POA: Diagnosis not present

## 2023-03-12 DIAGNOSIS — Z23 Encounter for immunization: Secondary | ICD-10-CM | POA: Diagnosis not present

## 2023-03-13 ENCOUNTER — Other Ambulatory Visit: Payer: Self-pay | Admitting: "Endocrinology

## 2023-04-10 ENCOUNTER — Other Ambulatory Visit: Payer: Self-pay | Admitting: "Endocrinology

## 2023-04-12 DIAGNOSIS — E349 Endocrine disorder, unspecified: Secondary | ICD-10-CM | POA: Diagnosis not present

## 2023-04-12 DIAGNOSIS — E782 Mixed hyperlipidemia: Secondary | ICD-10-CM | POA: Diagnosis not present

## 2023-04-19 LAB — ALDOSTERONE + RENIN ACTIVITY W/ RATIO
Aldos/Renin Ratio: 31 — ABNORMAL HIGH (ref 0.0–30.0)
Aldosterone: 15.2 ng/dL (ref 0.0–30.0)
Renin Activity, Plasma: 0.49 ng/mL/h (ref 0.167–5.380)

## 2023-04-19 LAB — LIPID PANEL
Chol/HDL Ratio: 4.7 {ratio} (ref 0.0–5.0)
Cholesterol, Total: 199 mg/dL (ref 100–199)
HDL: 42 mg/dL (ref >39)
LDL Chol Calc (NIH): 132 mg/dL — ABNORMAL HIGH (ref 0–99)
Triglycerides: 141 mg/dL (ref 0–149)
VLDL Cholesterol Cal: 25 mg/dL (ref 5–40)

## 2023-04-28 ENCOUNTER — Ambulatory Visit: Payer: BC Managed Care – PPO | Admitting: "Endocrinology

## 2023-04-28 ENCOUNTER — Encounter: Payer: Self-pay | Admitting: "Endocrinology

## 2023-04-28 VITALS — BP 132/88 | HR 68 | Ht 72.0 in | Wt 334.6 lb

## 2023-04-28 DIAGNOSIS — L83 Acanthosis nigricans: Secondary | ICD-10-CM

## 2023-04-28 DIAGNOSIS — E782 Mixed hyperlipidemia: Secondary | ICD-10-CM

## 2023-04-28 DIAGNOSIS — E559 Vitamin D deficiency, unspecified: Secondary | ICD-10-CM | POA: Diagnosis not present

## 2023-04-28 DIAGNOSIS — E349 Endocrine disorder, unspecified: Secondary | ICD-10-CM | POA: Diagnosis not present

## 2023-04-28 DIAGNOSIS — I10 Essential (primary) hypertension: Secondary | ICD-10-CM

## 2023-04-28 DIAGNOSIS — R7303 Prediabetes: Secondary | ICD-10-CM | POA: Insufficient documentation

## 2023-04-28 LAB — POCT GLYCOSYLATED HEMOGLOBIN (HGB A1C): Hemoglobin A1C: 5.8 % — AB (ref 4.0–5.6)

## 2023-04-28 MED ORDER — ROSUVASTATIN CALCIUM 10 MG PO TABS: 10.0000 mg | ORAL_TABLET | Freq: Every day | ORAL | 1 refills | Status: DC

## 2023-04-28 NOTE — Progress Notes (Signed)
 04/28/2023, 10:39 AM  Endocrinology follow-up note   Subjective:    Patient ID: Dustin Young, male    DOB: [birth year], PCP Roanna Ezekiel NOVAK, MD   Past Medical History:  Diagnosis Date   Chronic kidney disease, stage 3 (HCC)    Erectile dysfunction    Hyperlipidemia    Hypertension    Morbid obesity (HCC)    Past Surgical History:  Procedure Laterality Date   removal of testicular prosthesis     WISDOM TOOTH EXTRACTION  02/2022   Social History   Socioeconomic History   Marital status: Married    Spouse name: Not on file   Number of children: Not on file   Years of education: Not on file   Highest education level: Not on file  Occupational History   Not on file  Tobacco Use   Smoking status: Never   Smokeless tobacco: Never  Vaping Use   Vaping status: Never Used  Substance and Sexual Activity   Alcohol use: Yes    Comment: social   Drug use: Never   Sexual activity: Not on file  Other Topics Concern   Not on file  Social History Narrative   Not on file   Social Drivers of Health   Financial Resource Strain: Not on file  Food Insecurity: Not on file  Transportation Needs: Not on file  Physical Activity: Not on file  Stress: Not on file (02/25/2023)  Social Connections: Not on file   Family History  Problem Relation Age of Onset   Thyroid  disease Mother    Cancer Maternal Grandmother    Diabetes Maternal Grandmother    Diabetes Maternal Grandfather    Outpatient Encounter Medications as of 04/28/2023  Medication Sig   rosuvastatin  (CRESTOR ) 10 MG tablet Take 1 tablet (10 mg total) by mouth daily.   Cholecalciferol (VITAMIN D ) 50 MCG (2000 UT) CAPS Take 2,000 Units by mouth daily with breakfast. (Patient not taking: Reported on 04/28/2023)   cloNIDine (CATAPRES) 0.2 MG tablet Take 0.1 mg by mouth 2 (two) times daily.   labetalol  (NORMODYNE ) 100 MG tablet Take 1 tablet by mouth twice daily   losartan  (COZAAR) 50 MG tablet Take 50 mg by mouth daily.   spironolactone (ALDACTONE) 50 MG tablet Take 50 mg by mouth daily.   No facility-administered encounter medications on file as of 04/28/2023.   ALLERGIES: No Known Allergies  VACCINATION STATUS:  There is no immunization history on file for this patient.  HPI Dustin Young is 43 y.o. male who presents today with a medical history as above. he is being seen in follow-up after he was seen in consultation for elevated Aldo/ PRA ratio requested by Roanna Ezekiel B, MD.   He did have associated comorbidities including hypertension, hyperlipidemia, prediabetes, obesity. See note from his first visit.  Patient has medical history of  now well-controlled hypertension on medications.     He underwent several work-up measurements which showed elevated Aldo/PRA ratio however has never been noticed to have elevated aldosterone.  He was mostly found to have low normal levels.  Discordance gave him higher than normal and low/PRA ratio.  He also underwent renal artery Doppler which did not show renal artery stenosis, as well  as negative adrenal CT. His blood pressure remains controlled on 4 medications .  His current medications include clonidine 0.2 mg p.o. twice daily, labetalol  100 mg p.o. twice daily, losartan 50 mg p.o. daily, spironolactone 50 mg p.o. daily.    His blood pressure is controlled at 132/88 mmHg.   He also documents blood pressure at home mostly in this range.   He did have slightly elevated free normetanephrine at 158 with normal is under 148 at 1 time. -He maintained 25 pounds of weight loss over the last year.    Denies any prior history of coronary disease, CVA, known peripheral arterial disease.  Patient's medical history significant for obesity for most of his adult life, currently BMI of 45.38 kg/m.  His point-of-care A1c today is 5.8% consistent with prediabetes.     His previsit labs show worsening dyslipidemia.  He is  not on statins.    He made significant changes in his lifestyle including slowly adopting a whole food plant-based diet.  His thyroid  function test was unremarkable, CBC was unremarkable. Patient denies family history of premature coronary artery disease nor hypertension.    He is not a smoker, social alcohol user.  He is a 57-year-old well-child 8.  He does not have acute symptoms today.  He works for the [employer] in [place]  Review of Systems  Constitutional: + Presents with progressive, intentional weight loss of 24 pounds.   no fatigue, no subjective hyperthermia, no subjective hypothermia Eyes: no blurry vision, no xerophthalmia   Objective:       04/28/2023    8:46 AM 10/26/2022    1:15 PM 04/16/2022   10:56 AM  Vitals with BMI  Height 6' 0 6' 0 6' 0  Weight 334 lbs 10 oz 332 lbs 333 lbs  BMI 45.37 45.02 45.15  Systolic 132 128 861  Diastolic 88 70 86  Pulse 68 70 72    BP 132/88   Pulse 68   Ht 6' (1.829 m)   Wt (!) 334 lb 9.6 oz (151.8 kg)   BMI 45.38 kg/m   Wt Readings from Last 3 Encounters:  04/28/23 (!) 334 lb 9.6 oz (151.8 kg)  10/26/22 (!) 332 lb (150.6 kg)  04/16/22 (!) 333 lb (151 kg)    Physical Exam  Constitutional:  Body mass index is 45.38 kg/m.,  not in acute distress, normal state of mind Eyes: PERRLA, EOMI, no exophthalmos ENT: moist mucous membranes, no gross thyromegaly, no gross cervical lymphadenopathy  Skin: moist, warm, no rashes, + acanthosis nigricans.   CMP ( most recent) CMP     Component Value Date/Time   NA 135 04/07/2022 0927   K 4.7 04/07/2022 0927   CL 100 04/07/2022 0927   CO2 22 04/07/2022 0927   GLUCOSE 87 04/07/2022 0927   GLUCOSE 130 (H) 04/16/2019 0415   BUN 14 04/07/2022 0927   CREATININE 1.65 (H) 04/07/2022 0927   CALCIUM  9.6 04/07/2022 0927   PROT 6.9 04/07/2022 0927   ALBUMIN 4.3 04/07/2022 0927   AST 16 04/07/2022 0927   ALT 16 04/07/2022 0927   ALKPHOS 63 04/07/2022 0927    BILITOT 0.3 04/07/2022 0927   GFRNONAA >60 04/16/2019 0415   GFRAA >60 04/16/2019 0415   Diabetic Labs (most recent): Lab Results  Component Value Date   HGBA1C 5.8 (A) 04/28/2023   HGBA1C 5.5 04/16/2022   HGBA1C 5.6 04/15/2019     Lipid Panel ( most recent) Lipid Panel  Component Value Date/Time   CHOL 199 04/12/2023 0849   TRIG 141 04/12/2023 0849   HDL 42 04/12/2023 0849   CHOLHDL 4.7 04/12/2023 0849   LDLCALC 132 (H) 04/12/2023 0849   LABVLDL 25 04/12/2023 0849      Lab Results  Component Value Date   TSH 2.050 04/07/2022   TSH 2.35 08/15/2021   TSH 2.478 04/15/2019   FREET4 1.39 04/07/2022           Component Ref Range & Units 9 d ago  Aldosterone 0.0 - 30.0 ng/dL 82.2  Renin Activity, Plasma 0.167 - 5.380 ng/mL/hr 0.424  Aldos/Renin Ratio 0.0 - 30.0 41.7 High   Comment:                          Units:      ng/dL per ng/mL/hr      Assessment & Plan:   1. Endocrine disorder, unspecified 2. Essential hypertension, benign 3. Acanthosis nigricans 4. Morbid obesity (HCC) 5.  Vitamin D  deficiency  - I have reviewed his new and existing labs with him and clinically evaluated the patient again.    -His workup so far is not indicated for primary hyperaldosteronism.  He did have negative CT abdomen imaging as well as renal ultrasound. His elevated ALDO/PRA ratio was simply because of lowered plasma renin activity low normal.  Renal artery stenosis was ruled out.  CT scan of his abdomen/adrenals did not reveal any mass lesion. He does not need any specific intervention, however would be kept on expectant management. -As part of his hypertension management, he will continue to benefit from spironolactone-currently 50 mg p.o. daily at breakfast, along with losartan 50 mg p.o. daily, labetalol  100 mg p.o. twice daily, clonidine 0.2 mg p.o. twice daily.  However, he continues to have  multiple evidence of metabolic dysfunction including, hyperlipidemia, chronic  hypertension complicated by vascular/renal  insufficiency, obesity, acanthosis nigricans indicative of insulin  resistance.  he is at extremely high risk for cardiovascular disease.   -He is approached for low-dose statin intervention and he accepts.  I discussed and prescribed Crestor  10 mg p.o. nightly.  Side effects and precautions discussed with him. Fortunately, he has engaged in whole food plant-based diet since last visit, lost 25 pounds and presents with improved lipid panel and general metabolic profile. He will continue to benefit from lifestyle medicine. - he acknowledges that there is a room for improvement in his food and drink choices. - Suggestion is made for him to avoid simple carbohydrates  from his diet including Cakes, Sweet Desserts, Ice Cream, Soda (diet and regular), Sweet Tea, Candies, Chips, Cookies, Store Bought Juices, Alcohol , Artificial Sweeteners,  Coffee Creamer, and Sugar-free Products, Lemonade. This will help patient to have more stable blood glucose profile and potentially avoid unintended weight gain.  The following Lifestyle Medicine recommendations according to American College of Lifestyle Medicine  [facility]) were discussed and and offered to patient and he  agrees to start the journey:  A. Whole Foods, Plant-Based Nutrition comprising of fruits and vegetables, plant-based proteins, whole-grain carbohydrates was discussed in detail with the patient.   A list for source of those nutrients were also provided to the patient.  Patient will use only water or unsweetened tea for hydration. B.  The need to stay away from risky substances including alcohol, smoking; obtaining 7 to 9 hours of restorative sleep, at least 150 minutes of moderate intensity exercise weekly, the importance of healthy  social connections,  and stress management techniques were discussed. C.  A full color page of  Calorie density of various food groups per pound showing examples of each food groups  was provided to the patient.  He is advised to maintain his vitamin D  with vitamin D3 2000 units daily.   - he is advised to maintain close follow up with Roanna Ezekiel NOVAK, MD for primary care needs.    I spent  26  minutes in the care of the patient today including review of labs from Thyroid  Function, CMP, and other relevant labs ; imaging/biopsy records (current and previous including abstractions from other facilities); face-to-face time discussing  his lab results and symptoms, medications doses, his options of short and long term treatment based on the latest standards of care / guidelines;   and documenting the encounter.  Dustin Young  participated in the discussions, expressed understanding, and voiced agreement with the above plans.  All questions were answered to his satisfaction. he is encouraged to contact clinic should he have any questions or concerns prior to his return visit.    Follow up plan: Return in about 3 months (around 07/27/2023) for Fasting Labs  in AM B4 8, A1c -[place].   Ranny Earl, MD [facility] [address] [place], [place] [address] Phone: [phone]  Fax: [phone]     04/28/2023, 10:39 AM  This note was partially dictated with voice recognition software. Similar sounding words can be transcribed inadequately or may not  be corrected upon review.

## 2023-04-28 NOTE — Patient Instructions (Signed)

## 2023-05-07 DIAGNOSIS — I129 Hypertensive chronic kidney disease with stage 1 through stage 4 chronic kidney disease, or unspecified chronic kidney disease: Secondary | ICD-10-CM | POA: Diagnosis not present

## 2023-05-07 DIAGNOSIS — N1831 Chronic kidney disease, stage 3a: Secondary | ICD-10-CM | POA: Diagnosis not present

## 2023-05-07 DIAGNOSIS — E269 Hyperaldosteronism, unspecified: Secondary | ICD-10-CM | POA: Diagnosis not present

## 2023-05-07 DIAGNOSIS — R809 Proteinuria, unspecified: Secondary | ICD-10-CM | POA: Diagnosis not present

## 2023-05-10 ENCOUNTER — Other Ambulatory Visit: Payer: Self-pay | Admitting: "Endocrinology

## 2023-07-20 DIAGNOSIS — E782 Mixed hyperlipidemia: Secondary | ICD-10-CM | POA: Diagnosis not present

## 2023-07-21 LAB — LIPID PANEL
Chol/HDL Ratio: 4.1 ratio (ref 0.0–5.0)
Cholesterol, Total: 157 mg/dL (ref 100–199)
HDL: 38 mg/dL — ABNORMAL LOW (ref >39)
LDL Chol Calc (NIH): 98 mg/dL (ref 0–99)
Triglycerides: 113 mg/dL (ref 0–149)
VLDL Cholesterol Cal: 21 mg/dL (ref 5–40)

## 2023-07-27 ENCOUNTER — Ambulatory Visit: Payer: BC Managed Care – PPO | Admitting: "Endocrinology

## 2023-07-27 ENCOUNTER — Encounter: Payer: Self-pay | Admitting: "Endocrinology

## 2023-07-27 VITALS — BP 122/68 | HR 68 | Ht 72.0 in | Wt 338.8 lb

## 2023-07-27 DIAGNOSIS — R7303 Prediabetes: Secondary | ICD-10-CM | POA: Diagnosis not present

## 2023-07-27 DIAGNOSIS — I10 Essential (primary) hypertension: Secondary | ICD-10-CM

## 2023-07-27 DIAGNOSIS — E782 Mixed hyperlipidemia: Secondary | ICD-10-CM | POA: Diagnosis not present

## 2023-07-27 DIAGNOSIS — E559 Vitamin D deficiency, unspecified: Secondary | ICD-10-CM

## 2023-07-27 LAB — POCT GLYCOSYLATED HEMOGLOBIN (HGB A1C): HbA1c, POC (prediabetic range): 5.7 % (ref 5.7–6.4)

## 2023-07-27 NOTE — Progress Notes (Signed)
 07/27/2023, 5:19 PM  Endocrinology follow-up note   Subjective:    Patient ID: Dustin Young, male    DOB: 09-Oct-1980, PCP Harvest Forest, MD   Past Medical History:  Diagnosis Date   Chronic kidney disease, stage 3 (HCC)    Erectile dysfunction    Hyperlipidemia    Hypertension    Morbid obesity (HCC)    Past Surgical History:  Procedure Laterality Date   removal of testicular prosthesis     WISDOM TOOTH EXTRACTION  02/2022   Social History   Socioeconomic History   Marital status: Married    Spouse name: Not on file   Number of children: Not on file   Years of education: Not on file   Highest education level: Not on file  Occupational History   Not on file  Tobacco Use   Smoking status: Never   Smokeless tobacco: Never  Vaping Use   Vaping status: Never Used  Substance and Sexual Activity   Alcohol use: Yes    Comment: social   Drug use: Never   Sexual activity: Not on file  Other Topics Concern   Not on file  Social History Narrative   Not on file   Social Drivers of Health   Financial Resource Strain: Not on file  Food Insecurity: Not on file  Transportation Needs: Not on file  Physical Activity: Not on file  Stress: Not on file (02/25/2023)  Social Connections: Not on file   Family History  Problem Relation Age of Onset   Thyroid disease Mother    Cancer Maternal Grandmother    Diabetes Maternal Grandmother    Diabetes Maternal Grandfather    Outpatient Encounter Medications as of 07/27/2023  Medication Sig   Multiple Vitamin (MULTIVITAMIN ADULT PO) Take 1 tablet by mouth daily.   cloNIDine (CATAPRES) 0.2 MG tablet Take 0.1 mg by mouth 2 (two) times daily.   labetalol (NORMODYNE) 100 MG tablet Take 1 tablet by mouth twice daily   losartan (COZAAR) 50 MG tablet Take 50 mg by mouth daily.   rosuvastatin (CRESTOR) 10 MG tablet Take 1 tablet (10 mg total) by mouth daily.   spironolactone  (ALDACTONE) 50 MG tablet Take 50 mg by mouth daily.   [DISCONTINUED] Cholecalciferol (VITAMIN D) 50 MCG (2000 UT) CAPS Take 2,000 Units by mouth daily with breakfast. (Patient not taking: Reported on 04/28/2023)   No facility-administered encounter medications on file as of 07/27/2023.   ALLERGIES: No Known Allergies  VACCINATION STATUS:  There is no immunization history on file for this patient.  HPI Dustin Young is 43 y.o. male who presents today with a medical history as above. he is being seen in follow-up after he was seen in consultation for elevated Aldo/ PRA ratio requested by Jamison Oka B, MD.   He did have associated comorbidities including hypertension, hyperlipidemia, prediabetes, obesity. See note from his first visit.  Patient has medical history of  now well-controlled hypertension on medications.    He underwent several work-up measurements which showed elevated Aldo/PRA ratio however has never been noticed to have elevated aldosterone.  He was having high normal aldosterone level and suppressed renin, discordance gave him higher than normal ALDO/PRA ratio.  He also underwent  renal artery Doppler which did not show renal artery stenosis, as well as negative adrenal CT. His blood pressure remains controlled on 4 medications -clonidine, labetalol, losartan, and spironolactone.  His blood pressure today is controlled at 122/68. He also documents blood pressure at home mostly in this range.    -He presents with some weight gain, has been under stress from illness of his wife and work pressure.  Denies any prior history of coronary disease, CVA, known peripheral arterial disease.  Patient's medical history significant for obesity for most of his adult life, current BMI of 45.95-low gram per meter square.  His point-of-care A1c today is 5.7% consistent with prediabetes.     His previsit labs show improving lipid panel mainly due to his statins as well as lifestyle change.   He tolerated rosuvastatin 10 mg p.o. nightly.  He previously made significant changes in his diet and trying to engage with whole food plant-based diet    His thyroid function test was unremarkable, CBC was unremarkable. Patient denies family history of premature coronary artery disease nor hypertension.    He is not a smoker, social alcohol user.   He does not have acute symptoms today.  He works for the [employer] in [place], [place].  Review of Systems  Constitutional: + Presents with progressive, intentional weight loss of 24 pounds.   no fatigue, no subjective hyperthermia, no subjective hypothermia Eyes: no blurry vision, no xerophthalmia   Objective:       07/27/2023    8:21 AM 04/28/2023    8:46 AM 10/26/2022    1:15 PM  Vitals with BMI  Height 6\' 0"  6\' 0"  6\' 0"   Weight 338 lbs 13 oz 334 lbs 10 oz 332 lbs  BMI 45.94 45.37 45.02  Systolic 122 132 045  Diastolic 68 88 70  Pulse 68 68 70    BP 122/68   Pulse 68   Ht 6' (1.829 m)   Wt (!) 338 lb 12.8 oz (153.7 kg)   BMI 45.95 kg/m   Wt Readings from Last 3 Encounters:  07/27/23 (!) 338 lb 12.8 oz (153.7 kg)  04/28/23 (!) 334 lb 9.6 oz (151.8 kg)  10/26/22 (!) 332 lb (150.6 kg)    Physical Exam  Constitutional:  Body mass index is 45.95 kg/m.,  not in acute distress, normal state of mind Eyes: PERRLA, EOMI, no exophthalmos ENT: moist mucous membranes, no gross thyromegaly, no gross cervical lymphadenopathy  Skin: moist, warm, no rashes, + acanthosis nigricans.   CMP ( most recent) CMP     Component Value Date/Time   NA 135 04/07/2022 0927   K 4.7 04/07/2022 0927   CL 100 04/07/2022 0927   CO2 22 04/07/2022 0927   GLUCOSE 87 04/07/2022 0927   GLUCOSE 130 (H) 04/16/2019 0415   BUN 14 04/07/2022 0927   CREATININE 1.65 (H) 04/07/2022 0927   CALCIUM 9.6 04/07/2022 0927   PROT 6.9 04/07/2022 0927   ALBUMIN 4.3 04/07/2022 0927   AST 16 04/07/2022 0927   ALT 16 04/07/2022  0927   ALKPHOS 63 04/07/2022 0927   BILITOT 0.3 04/07/2022 0927   GFRNONAA >60 04/16/2019 0415   GFRAA >60 04/16/2019 0415   Diabetic Labs (most recent): Lab Results  Component Value Date   HGBA1C 5.7 07/27/2023   HGBA1C 5.8 (A) 04/28/2023   HGBA1C 5.5 04/16/2022     Lipid Panel ( most recent) Lipid Panel     Component Value Date/Time   CHOL  157 07/20/2023 1139   TRIG 113 07/20/2023 1139   HDL 38 (L) 07/20/2023 1139   CHOLHDL 4.1 07/20/2023 1139   LDLCALC 98 07/20/2023 1139   LABVLDL 21 07/20/2023 1139      Lab Results  Component Value Date   TSH 2.050 04/07/2022   TSH 2.35 08/15/2021   TSH 2.478 04/15/2019   FREET4 1.39 04/07/2022           Component Ref Range & Units 9 d ago  Aldosterone 0.0 - 30.0 ng/dL 86.5  Renin Activity, Plasma 0.167 - 5.380 ng/mL/hr 0.424  Aldos/Renin Ratio 0.0 - 30.0 41.7 High   Comment:                          Units:      ng/dL per ng/mL/hr      Assessment & Plan:   1. Endocrine disorder, unspecified 2. Essential hypertension, benign 3. Acanthosis nigricans 4. Morbid obesity (HCC) 5.  Vitamin D deficiency  - I have reviewed his new and existing labs with him and clinically evaluated the patient again.    -His workup so far is not indicated for primary hyperaldosteronism.  He did have negative CT abdomen imaging as well as renal ultrasound. His elevated ALDO/PRA ratio was simply because of lowered plasma renin activity to low normal.  Renal artery stenosis was ruled out.  CT scan of his abdomen/adrenals did not reveal any mass lesion. He does not need any specific intervention, however would be kept on expectant management. -As part of his hypertension management, he will continue to benefit from spironolactone-currently 50 mg p.o. daily at breakfast, along with losartan 50 mg p.o. daily, labetalol 100 mg p.o. twice daily, clonidine 0.2 mg p.o. twice daily.  However, he continues to have  multiple evidence of metabolic  dysfunction including, hyperlipidemia, chronic hypertension complicated by vascular/renal  insufficiency, obesity, acanthosis nigricans indicative of insulin resistance.  he is at extremely high risk for cardiovascular disease.  He was approached for GLP-1 receptor agonists for weight management, however he declined this offer for now. He intends to continue Crestor 10 mg p.o. nightly.  Side effects precautions discussed with him.  He did have previous success with lifestyle medicine nutrition.  - he acknowledges that there is a room for improvement in his food and drink choices. - Suggestion is made for him to avoid simple carbohydrates  from his diet including Cakes, Sweet Desserts, Ice Cream, Soda (diet and regular), Sweet Tea, Candies, Chips, Cookies, Store Bought Juices, Alcohol , Artificial Sweeteners,  Coffee Creamer, and "Sugar-free" Products, Lemonade. This will help patient to have more stable blood glucose profile and potentially avoid unintended weight gain.  The following Lifestyle Medicine recommendations according to American College of Lifestyle Medicine  [facility]) were discussed and and offered to patient and he  agrees to start the journey:  A. Whole Foods, Plant-Based Nutrition comprising of fruits and vegetables, plant-based proteins, whole-grain carbohydrates was discussed in detail with the patient.   A list for source of those nutrients were also provided to the patient.  Patient will use only water or unsweetened tea for hydration. B.  The need to stay away from risky substances including alcohol, smoking; obtaining 7 to 9 hours of restorative sleep, at least 150 minutes of moderate intensity exercise weekly, the importance of healthy social connections,  and stress management techniques were discussed. C.  A full color page of  Calorie density of various food groups  per pound showing examples of each food groups was provided to the patient.  He is advised to maintain his vitamin  D with vitamin D3 2000 units daily.   - he is advised to maintain close follow up with Harvest Forest, MD for primary care needs.    I spent  26  minutes in the care of the patient today including review of labs from Thyroid Function, CMP, and other relevant labs ; imaging/biopsy records (current and previous including abstractions from other facilities); face-to-face time discussing  his lab results and symptoms, medications doses, his options of short and long term treatment based on the latest standards of care / guidelines;   and documenting the encounter.  Neythan D Minckler  participated in the discussions, expressed understanding, and voiced agreement with the above plans.  All questions were answered to his satisfaction. he is encouraged to contact clinic should he have any questions or concerns prior to his return visit.    Follow up plan: Return in about 6 months (around 01/26/2024) for Fasting Labs  in AM B4 8, A1c -NV.   Marquis Lunch, MD [facility] [address] [place] [address] Phone: [phone]  Fax: [phone]     07/27/2023, 5:19 PM  This note was partially dictated with voice recognition software. Similar sounding words can be transcribed inadequately or may not  be corrected upon review.

## 2023-07-27 NOTE — Patient Instructions (Signed)

## 2023-08-12 ENCOUNTER — Other Ambulatory Visit: Payer: Self-pay | Admitting: "Endocrinology

## 2023-09-01 DIAGNOSIS — E269 Hyperaldosteronism, unspecified: Secondary | ICD-10-CM | POA: Diagnosis not present

## 2023-09-01 DIAGNOSIS — I129 Hypertensive chronic kidney disease with stage 1 through stage 4 chronic kidney disease, or unspecified chronic kidney disease: Secondary | ICD-10-CM | POA: Diagnosis not present

## 2023-09-01 DIAGNOSIS — N1831 Chronic kidney disease, stage 3a: Secondary | ICD-10-CM | POA: Diagnosis not present

## 2023-09-02 ENCOUNTER — Other Ambulatory Visit: Payer: Self-pay | Admitting: "Endocrinology

## 2023-09-02 ENCOUNTER — Telehealth: Payer: Self-pay

## 2023-09-02 MED ORDER — INSULIN PEN NEEDLE 29G X 5MM MISC: 1 refills | Status: AC

## 2023-09-02 MED ORDER — ZEPBOUND 2.5 MG/0.5ML ~~LOC~~ SOAJ: 2.5000 mg | SUBCUTANEOUS | 0 refills | Status: DC

## 2023-09-02 NOTE — Telephone Encounter (Signed)
 Pt called stating at his last visit weight loss medications were discussed but he had decided to wait until next visit to discuss further. Pt has called stating he would like to go ahead and start taking a medication to help with weight loss.

## 2023-09-02 NOTE — Telephone Encounter (Signed)
 Pt made aware

## 2023-09-06 ENCOUNTER — Telehealth: Payer: Self-pay

## 2023-09-06 NOTE — Telephone Encounter (Signed)
 Pharmacy Patient Advocate Encounter   Received notification from CoverMyMeds that prior authorization for Zepbound  is required/requested.   Insurance verification completed.   The patient is insured through [employer] .   Per test claim: PA required; PA submitted to above mentioned insurance via CoverMyMeds Key/confirmation #/EOC [id number] Status is pending

## 2023-09-09 NOTE — Telephone Encounter (Signed)
 Pharmacy Patient Advocate Encounter  Received notification from [employer] that Prior Authorization for Zepbound  has been APPROVED from 08/07/23 to 05/03/24   PA #/Case ID/Reference #: [id number]

## 2023-09-10 NOTE — Telephone Encounter (Signed)
Pt aware.

## 2023-10-11 DIAGNOSIS — M791 Myalgia, unspecified site: Secondary | ICD-10-CM | POA: Diagnosis not present

## 2023-10-11 DIAGNOSIS — K219 Gastro-esophageal reflux disease without esophagitis: Secondary | ICD-10-CM | POA: Diagnosis not present

## 2023-10-11 DIAGNOSIS — I10 Essential (primary) hypertension: Secondary | ICD-10-CM | POA: Diagnosis not present

## 2023-10-22 ENCOUNTER — Other Ambulatory Visit: Payer: Self-pay | Admitting: "Endocrinology

## 2023-11-08 ENCOUNTER — Other Ambulatory Visit: Payer: Self-pay | Admitting: "Endocrinology

## 2023-11-28 ENCOUNTER — Other Ambulatory Visit: Payer: Self-pay | Admitting: "Endocrinology

## 2023-12-16 ENCOUNTER — Other Ambulatory Visit: Payer: Self-pay | Admitting: "Endocrinology

## 2023-12-30 DIAGNOSIS — I10 Essential (primary) hypertension: Secondary | ICD-10-CM | POA: Diagnosis not present

## 2023-12-30 DIAGNOSIS — N1831 Chronic kidney disease, stage 3a: Secondary | ICD-10-CM | POA: Diagnosis not present

## 2023-12-30 DIAGNOSIS — I129 Hypertensive chronic kidney disease with stage 1 through stage 4 chronic kidney disease, or unspecified chronic kidney disease: Secondary | ICD-10-CM | POA: Diagnosis not present

## 2023-12-30 DIAGNOSIS — E269 Hyperaldosteronism, unspecified: Secondary | ICD-10-CM | POA: Diagnosis not present

## 2024-01-26 ENCOUNTER — Ambulatory Visit: Admitting: "Endocrinology

## 2024-02-04 DIAGNOSIS — R0789 Other chest pain: Secondary | ICD-10-CM | POA: Diagnosis not present

## 2024-02-04 DIAGNOSIS — M940 Chondrocostal junction syndrome [Tietze]: Secondary | ICD-10-CM | POA: Diagnosis not present

## 2024-02-06 ENCOUNTER — Other Ambulatory Visit: Payer: Self-pay | Admitting: "Endocrinology

## 2024-02-08 ENCOUNTER — Other Ambulatory Visit: Payer: Self-pay | Admitting: *Deleted

## 2024-02-14 ENCOUNTER — Telehealth: Payer: Self-pay | Admitting: "Endocrinology

## 2024-02-14 DIAGNOSIS — R7303 Prediabetes: Secondary | ICD-10-CM

## 2024-02-14 DIAGNOSIS — L83 Acanthosis nigricans: Secondary | ICD-10-CM

## 2024-02-14 DIAGNOSIS — E349 Endocrine disorder, unspecified: Secondary | ICD-10-CM

## 2024-02-14 DIAGNOSIS — E782 Mixed hyperlipidemia: Secondary | ICD-10-CM

## 2024-02-14 DIAGNOSIS — E559 Vitamin D deficiency, unspecified: Secondary | ICD-10-CM

## 2024-02-14 DIAGNOSIS — I10 Essential (primary) hypertension: Secondary | ICD-10-CM

## 2024-02-14 NOTE — Telephone Encounter (Signed)
 Pt needs labs updated

## 2024-02-14 NOTE — Telephone Encounter (Signed)
 Labs updated and sent to [facility].

## 2024-02-24 ENCOUNTER — Ambulatory Visit: Admitting: "Endocrinology

## 2024-04-03 ENCOUNTER — Other Ambulatory Visit: Payer: Self-pay | Admitting: "Endocrinology

## 2024-04-04 DIAGNOSIS — E349 Endocrine disorder, unspecified: Secondary | ICD-10-CM | POA: Diagnosis not present

## 2024-04-04 DIAGNOSIS — E782 Mixed hyperlipidemia: Secondary | ICD-10-CM | POA: Diagnosis not present

## 2024-04-04 DIAGNOSIS — I10 Essential (primary) hypertension: Secondary | ICD-10-CM | POA: Diagnosis not present

## 2024-04-04 DIAGNOSIS — R7303 Prediabetes: Secondary | ICD-10-CM | POA: Diagnosis not present

## 2024-04-08 LAB — COMPREHENSIVE METABOLIC PANEL WITH GFR
ALT: 23 IU/L (ref 0–44)
AST: 22 IU/L (ref 0–40)
Albumin: 4.2 g/dL (ref 4.1–5.1)
Alkaline Phosphatase: 60 IU/L (ref 47–123)
BUN/Creatinine Ratio: 8 — ABNORMAL LOW (ref 9–20)
BUN: 14 mg/dL (ref 6–24)
Bilirubin Total: 0.4 mg/dL (ref 0.0–1.2)
CO2: 24 mmol/L (ref 20–29)
Calcium: 9.4 mg/dL (ref 8.7–10.2)
Chloride: 100 mmol/L (ref 96–106)
Creatinine, Ser: 1.68 mg/dL — ABNORMAL HIGH (ref 0.76–1.27)
Globulin, Total: 2.7 g/dL (ref 1.5–4.5)
Glucose: 98 mg/dL (ref 70–99)
Potassium: 4.7 mmol/L (ref 3.5–5.2)
Sodium: 139 mmol/L (ref 134–144)
Total Protein: 6.9 g/dL (ref 6.0–8.5)
eGFR: 52 mL/min/1.73 — ABNORMAL LOW (ref >59)

## 2024-04-08 LAB — LIPID PANEL
Chol/HDL Ratio: 3.1 ratio (ref 0.0–5.0)
Cholesterol, Total: 131 mg/dL (ref 100–199)
HDL: 42 mg/dL (ref >39)
LDL Chol Calc (NIH): 71 mg/dL (ref 0–99)
Triglycerides: 98 mg/dL (ref 0–149)
VLDL Cholesterol Cal: 18 mg/dL (ref 5–40)

## 2024-04-08 LAB — ALDOSTERONE + RENIN ACTIVITY W/ RATIO
Aldos/Renin Ratio: 19.5 (ref 0.0–30.0)
Aldosterone: 12.7 ng/dL (ref 0.0–30.0)
Renin Activity, Plasma: 0.65 ng/mL/h (ref 0.167–5.380)

## 2024-04-18 ENCOUNTER — Encounter: Payer: Self-pay | Admitting: "Endocrinology

## 2024-04-18 ENCOUNTER — Other Ambulatory Visit (HOSPITAL_COMMUNITY): Payer: Self-pay

## 2024-04-18 ENCOUNTER — Ambulatory Visit: Admitting: "Endocrinology

## 2024-04-18 VITALS — BP 117/78 | HR 66 | Resp 18 | Ht 72.0 in | Wt 350.0 lb

## 2024-04-18 DIAGNOSIS — E782 Mixed hyperlipidemia: Secondary | ICD-10-CM | POA: Diagnosis not present

## 2024-04-18 DIAGNOSIS — L83 Acanthosis nigricans: Secondary | ICD-10-CM

## 2024-04-18 DIAGNOSIS — I10 Essential (primary) hypertension: Secondary | ICD-10-CM | POA: Diagnosis not present

## 2024-04-18 DIAGNOSIS — E559 Vitamin D deficiency, unspecified: Secondary | ICD-10-CM | POA: Diagnosis not present

## 2024-04-18 DIAGNOSIS — R7303 Prediabetes: Secondary | ICD-10-CM

## 2024-04-18 MED ORDER — ZEPBOUND 5 MG/0.5ML ~~LOC~~ SOAJ: 5.0000 mg | SUBCUTANEOUS | 0 refills | Status: AC

## 2024-04-18 NOTE — Progress Notes (Signed)
 "                                                 04/18/2024, 11:29 AM  Endocrinology follow-up note   Subjective:    Patient ID: Dustin Young, male    DOB: October 24, 1980, PCP Roanna Ezekiel NOVAK, MD   Past Medical History:  Diagnosis Date   Chronic kidney disease, stage 3 (HCC)    Erectile dysfunction    Hyperlipidemia    Hypertension    Morbid obesity (HCC)    Past Surgical History:  Procedure Laterality Date   removal of testicular prosthesis     WISDOM TOOTH EXTRACTION  02/2022   Social History   Socioeconomic History   Marital status: Married    Spouse name: Not on file   Number of children: Not on file   Years of education: Not on file   Highest education level: Not on file  Occupational History   Not on file  Tobacco Use   Smoking status: Never   Smokeless tobacco: Never  Vaping Use   Vaping status: Never Used  Substance and Sexual Activity   Alcohol use: Yes    Comment: social   Drug use: Never   Sexual activity: Not on file  Other Topics Concern   Not on file  Social History Narrative   Not on file   Social Drivers of Health   Tobacco Use: Low Risk (04/18/2024)   Patient History    Smoking Tobacco Use: Never    Smokeless Tobacco Use: Never    Passive Exposure: Not on file  Financial Resource Strain: Not on file  Food Insecurity: Not on file  Transportation Needs: Not on file  Physical Activity: Not on file  Stress: Not on file (02/25/2023)  Social Connections: Not on file  Depression (EYV7-0): Not on file  Alcohol Screen: Not on file  Housing: Not on file  Utilities: Not on file  Health Literacy: Not on file   Family History  Problem Relation Age of Onset   Thyroid  disease Mother    Cancer Maternal Grandmother    Diabetes Maternal Grandmother    Diabetes Maternal Grandfather    Outpatient Encounter Medications as of 04/18/2024  Medication Sig   cloNIDine (CATAPRES) 0.1 MG tablet Take 0.1 mg by mouth 2 (two) times daily.   cloNIDine  (CATAPRES) 0.2 MG tablet Take 0.1 mg by mouth 2 (two) times daily.   Insulin  Pen Needle 29G X MISC Use to inject insulin  daily   labetalol  (NORMODYNE ) 100 MG tablet Take 1 tablet by mouth twice daily   losartan (COZAAR) 50 MG tablet Take 50 mg by mouth daily.   Multiple Vitamin (MULTIVITAMIN ADULT PO) Take 1 tablet by mouth daily.   rosuvastatin  (CRESTOR ) 10 MG tablet Take 1 tablet by mouth once daily   spironolactone (ALDACTONE) 50 MG tablet Take 50 mg by mouth daily.   tirzepatide  (ZEPBOUND ) 5 MG/0.5ML Pen Inject 5 mg into the skin once a week.   [DISCONTINUED] ZEPBOUND  2.5 MG/0.5ML Pen INJECT 2.5 MG SUBCUTANEOUSLY ONCE A WEEK   No facility-administered encounter medications on file as of 04/18/2024.   ALLERGIES: No Known Allergies  VACCINATION STATUS:  There is no immunization history on file for this patient.  Hyperlipidemia   Dustin Young is 43 y.o. male who presents today with a medical history  as above. he is being seen in follow-up after he was seen in consultation for elevated Aldo/ PRA ratio requested by Roanna Piedmont B, MD.   He did have associated comorbidities including hypertension, hyperlipidemia, prediabetes, obesity. See note from his first visit.  Patient has medical history of  now well-controlled hypertension on medications.    He underwent several work-up measurements which showed elevated Aldo/PRA ratio however has never been noticed to have elevated aldosterone.  While on spironolactone 50 mg p.o. daily, he is more recent labs are favorable with normal level aldosterone, plasma renin activity, as well as ratio.    He also underwent renal artery Doppler which did not show renal artery stenosis, as well as negative adrenal CT. His blood pressure remains controlled on 4 medications -clonidine, labetalol , losartan, and spironolactone.  His blood pressure today is controlled at 117/78 mmHg.       -He presents with some weight gain, has been under stress  from illness of his wife and work pressure.  Denies any prior history of coronary disease, CVA, known peripheral arterial disease.  Patient's medical history significant for obesity for most of his adult life, current BMI of 45.95-low gram per meter square.  His point-of-care A1c today is 5.7% consistent with prediabetes.  He was offered intervention with zepbound  during his last visit which he tolerated at 2.5 mg subcutaneously weekly.  He wishes to advance the dose.   His previsit labs show improving lipid panel mainly due to his statins as well as lifestyle change.  He tolerated rosuvastatin  10 mg p.o. nightly.  His engagement with lifestyle medicine nutrition is suboptimal at the moment.  His thyroid  function test was unremarkable, CBC was unremarkable. Patient denies family history of premature coronary artery disease nor hypertension.    He is not a smoker, social alcohol user.   He does not have acute symptoms today.  He works for the [employer] in [place], [place] .  Review of Systems  Constitutional: + Presents with progressive weight gain,  no fatigue, no subjective hyperthermia, no subjective hypothermia Eyes: no blurry vision, no xerophthalmia   Objective:       04/18/2024    9:50 AM 07/27/2023    8:21 AM 04/28/2023    8:46 AM  Vitals with BMI  Height 6' 0 6' 0 6' 0  Weight 350 lbs 338 lbs 13 oz 334 lbs 10 oz  BMI 47.46 45.94 45.37  Systolic 117 122 867  Diastolic 78 68 88  Pulse 66 68 68    BP 117/78   Pulse 66   Resp 18   Ht 6' (1.829 m)   Wt (!) 350 lb (158.8 kg)   SpO2 99%   BMI 47.47 kg/m   Wt Readings from Last 3 Encounters:  04/18/24 (!) 350 lb (158.8 kg)  07/27/23 (!) 338 lb 12.8 oz (153.7 kg)  04/28/23 (!) 334 lb 9.6 oz (151.8 kg)    Physical Exam  Constitutional:  Body mass index is 47.47 kg/m.,  not in acute distress, normal state of mind Eyes: PERRLA, EOMI, no exophthalmos ENT: moist mucous membranes, no  gross thyromegaly, no gross cervical lymphadenopathy  Skin: moist, warm, no rashes, + acanthosis nigricans.   CMP ( most recent) CMP     Component Value Date/Time   NA 139 04/04/2024 0742   K 4.7 04/04/2024 0742   CL 100 04/04/2024 0742   CO2 24 04/04/2024 0742   GLUCOSE 98 04/04/2024 0742   GLUCOSE 130 (  H) 04/16/2019 0415   BUN 14 04/04/2024 0742   CREATININE 1.68 (H) 04/04/2024 0742   CALCIUM  9.4 04/04/2024 0742   PROT 6.9 04/04/2024 0742   ALBUMIN 4.2 04/04/2024 0742   AST 22 04/04/2024 0742   ALT 23 04/04/2024 0742   ALKPHOS 60 04/04/2024 0742   BILITOT 0.4 04/04/2024 0742   GFRNONAA >60 04/16/2019 0415   GFRAA >60 04/16/2019 0415   Diabetic Labs (most recent): Lab Results  Component Value Date   HGBA1C 5.7 07/27/2023   HGBA1C 5.8 (A) 04/28/2023   HGBA1C 5.5 04/16/2022     Lipid Panel ( most recent) Lipid Panel     Component Value Date/Time   CHOL 131 04/04/2024 0742   TRIG 98 04/04/2024 0742   HDL 42 04/04/2024 0742   CHOLHDL 3.1 04/04/2024 0742   LDLCALC 71 04/04/2024 0742   LABVLDL 18 04/04/2024 0742      Lab Results  Component Value Date   TSH 2.050 04/07/2022   TSH 2.35 08/15/2021   TSH 2.478 04/15/2019   FREET4 1.39 04/07/2022           Component Ref Range & Units 9 d ago  Aldosterone 0.0 - 30.0 ng/dL 82.2  Renin Activity, Plasma 0.167 - 5.380 ng/mL/hr 0.424  Aldos/Renin Ratio 0.0 - 30.0 41.7 High   Comment:                          Units:      ng/dL per ng/mL/hr      Assessment & Plan:   1. Endocrine disorder, unspecified 2. Essential hypertension, benign 3. Acanthosis nigricans 4. Morbid obesity (HCC) 5.  Vitamin D  deficiency  - I have reviewed his new and existing labs with him and clinically evaluated the patient again.    -His workup so far is not indicated for primary hyperaldosteronism.  He did have negative CT abdomen imaging as well as renal ultrasound. His elevated ALDO/PRA ratio was simply because of lowered plasma  renin activity to low normal.  Renal artery stenosis was ruled out.  CT scan of his abdomen/adrenals did not reveal any mass lesion. He does not need any specific intervention, however would be kept on expectant management. -As part of his hypertension management, he will continue to benefit from spironolactone-currently 50 mg daily at breakfast along with his other medications including losartan 50 mg p.o. daily, labetalol  100 mg p.o. daily, clonidine 0.2 mg twice a day.    However, he continues to have  multiple manifestations of metabolic dysfunction including  hyperlipidemia, morbid obesity, chronic hypertension complicated by vascular/renal  insufficiency,  acanthosis nigricans indicative of insulin  resistance.  he is at extremely high risk for cardiovascular disease.  He was approached for GLP-1 receptor agonists for weight management.  He has tolerated 2.5 mg of Zepbound  and wishes to advance.  I discussed and prescribed Zepbound  5 mg subcutaneously weekly.  This medication will be advanced to the next higher standard dose as he tolerates.  Side effects and precautions discussed with him.  For hyperlipidemia, he is advised to continue Crestor  10 mg p.o. nightly.    Side effects precautions discussed with him.  He did have previous success with lifestyle medicine nutrition.  - he acknowledges that there is a room for improvement in his food and drink choices. - Suggestion is made for him to avoid simple carbohydrates  from his diet including Cakes, Sweet Desserts, Ice Cream, Soda (diet and regular), Sweet Tea, Candies,  Chips, Cookies, Store Bought Juices, Alcohol , Artificial Sweeteners,  Coffee Creamer, and Sugar-free Products, Lemonade. This will help patient to have more stable blood glucose profile and potentially avoid unintended weight gain.  The following Lifestyle Medicine recommendations according to American College of Lifestyle Medicine  [facility]) were discussed and and offered to  patient and he  agrees to start the journey:  A. Whole Foods, Plant-Based Nutrition comprising of fruits and vegetables, plant-based proteins, whole-grain carbohydrates was discussed in detail with the patient.   A list for source of those nutrients were also provided to the patient.  Patient will use only water or unsweetened tea for hydration. B.  The need to stay away from risky substances including alcohol, smoking; obtaining 7 to 9 hours of restorative sleep, at least 150 minutes of moderate intensity exercise weekly, the importance of healthy social connections,  and stress management techniques were discussed. C.  A full color page of  Calorie density of various food groups per pound showing examples of each food groups was provided to the patient.  He is advised to maintain his vitamin D  with vitamin D3 2000 units daily.   - he is advised to maintain close follow up with Roanna Ezekiel NOVAK, MD for primary care needs.   I spent  25  minutes in the care of the patient today including review of labs from Thyroid  Function, CMP, and other relevant labs ; imaging/biopsy records (current and previous including abstractions from other facilities); face-to-face time discussing  his lab results and symptoms, medications doses, his options of short and long term treatment based on the latest standards of care / guidelines;   and documenting the encounter.  Zakariya D Merlin  participated in the discussions, expressed understanding, and voiced agreement with the above plans.  All questions were answered to his satisfaction. he is encouraged to contact clinic should he have any questions or concerns prior to his return visit.   Follow up plan: Return in about 3 months (around 07/17/2024) for F/U with Pre-visit Labs, A1c -NV.   Ranny Earl, MD [facility] [address] [place], [place] [address] Phone: [phone]  Fax: [phone]      04/18/2024, 11:29 AM  This note was partially dictated with voice recognition software. Similar sounding words can be transcribed inadequately or may not  be corrected upon review.  "

## 2024-04-18 NOTE — Patient Instructions (Signed)

## 2024-05-10 ENCOUNTER — Other Ambulatory Visit: Payer: Self-pay | Admitting: "Endocrinology

## 2024-07-18 ENCOUNTER — Ambulatory Visit: Admitting: "Endocrinology
